# Patient Record
Sex: Female | Born: 1935
Health system: Southern US, Community
[De-identification: ages and names within clinical notes are randomized; demographics above are authoritative.]

## PROBLEM LIST (undated history)

## (undated) DIAGNOSIS — R011 Cardiac murmur, unspecified: Secondary | ICD-10-CM

## (undated) DIAGNOSIS — I10 Essential (primary) hypertension: Secondary | ICD-10-CM

## (undated) DIAGNOSIS — E7800 Pure hypercholesterolemia, unspecified: Secondary | ICD-10-CM

## (undated) DIAGNOSIS — F419 Anxiety disorder, unspecified: Secondary | ICD-10-CM

## (undated) DIAGNOSIS — K219 Gastro-esophageal reflux disease without esophagitis: Secondary | ICD-10-CM

## (undated) DIAGNOSIS — F329 Major depressive disorder, single episode, unspecified: Secondary | ICD-10-CM

## (undated) DIAGNOSIS — F32A Depression, unspecified: Secondary | ICD-10-CM

## (undated) DIAGNOSIS — I2510 Atherosclerotic heart disease of native coronary artery without angina pectoris: Secondary | ICD-10-CM

## (undated) DIAGNOSIS — D126 Benign neoplasm of colon, unspecified: Secondary | ICD-10-CM

## (undated) DIAGNOSIS — R7301 Impaired fasting glucose: Secondary | ICD-10-CM

## (undated) DIAGNOSIS — R002 Palpitations: Secondary | ICD-10-CM

## (undated) HISTORY — DX: Benign neoplasm of colon, unspecified: D12.6

## (undated) HISTORY — DX: Depression, unspecified: F32.A

## (undated) HISTORY — DX: Essential (primary) hypertension: I10

## (undated) HISTORY — DX: Gastro-esophageal reflux disease without esophagitis: K21.9

## (undated) HISTORY — DX: Pure hypercholesterolemia, unspecified: E78.00

## (undated) HISTORY — DX: Major depressive disorder, single episode, unspecified: F32.9

## (undated) HISTORY — DX: Atherosclerotic heart disease of native coronary artery without angina pectoris: I25.10

## (undated) HISTORY — DX: Impaired fasting glucose: R73.01

## (undated) HISTORY — DX: Palpitations: R00.2

## (undated) HISTORY — DX: Anxiety disorder, unspecified: F41.9

---

## 1998-10-30 ENCOUNTER — Other Ambulatory Visit: Admission: RE | Admit: 1998-10-30 | Discharge: 1998-10-30 | Payer: Self-pay | Admitting: Gynecology

## 2000-01-01 ENCOUNTER — Other Ambulatory Visit: Admission: RE | Admit: 2000-01-01 | Discharge: 2000-01-01 | Payer: Self-pay | Admitting: Gynecology

## 2000-12-17 ENCOUNTER — Other Ambulatory Visit: Admission: RE | Admit: 2000-12-17 | Discharge: 2000-12-17 | Payer: Self-pay | Admitting: Gynecology

## 2001-04-20 ENCOUNTER — Encounter: Payer: Self-pay | Admitting: Urology

## 2001-04-22 ENCOUNTER — Encounter: Payer: Self-pay | Admitting: Urology

## 2001-04-22 ENCOUNTER — Ambulatory Visit (HOSPITAL_COMMUNITY): Admission: RE | Admit: 2001-04-22 | Discharge: 2001-04-22 | Payer: Self-pay | Admitting: Urology

## 2001-04-26 ENCOUNTER — Ambulatory Visit (HOSPITAL_COMMUNITY): Admission: RE | Admit: 2001-04-26 | Discharge: 2001-04-26 | Payer: Self-pay | Admitting: Urology

## 2001-04-26 ENCOUNTER — Encounter: Payer: Self-pay | Admitting: Urology

## 2001-12-02 ENCOUNTER — Ambulatory Visit (HOSPITAL_COMMUNITY): Admission: RE | Admit: 2001-12-02 | Discharge: 2001-12-02 | Payer: Self-pay | Admitting: Urology

## 2001-12-02 ENCOUNTER — Encounter: Payer: Self-pay | Admitting: Urology

## 2001-12-20 ENCOUNTER — Other Ambulatory Visit: Admission: RE | Admit: 2001-12-20 | Discharge: 2001-12-20 | Payer: Self-pay | Admitting: Gynecology

## 2002-02-08 ENCOUNTER — Ambulatory Visit (HOSPITAL_COMMUNITY): Admission: RE | Admit: 2002-02-08 | Discharge: 2002-02-08 | Payer: Self-pay | Admitting: Family Medicine

## 2002-07-21 ENCOUNTER — Encounter: Admission: RE | Admit: 2002-07-21 | Discharge: 2002-07-21 | Payer: Self-pay

## 2002-09-19 ENCOUNTER — Ambulatory Visit (HOSPITAL_BASED_OUTPATIENT_CLINIC_OR_DEPARTMENT_OTHER): Admission: RE | Admit: 2002-09-19 | Discharge: 2002-09-19 | Payer: Self-pay | Admitting: Gynecology

## 2002-09-19 ENCOUNTER — Encounter (INDEPENDENT_AMBULATORY_CARE_PROVIDER_SITE_OTHER): Payer: Self-pay

## 2003-02-06 ENCOUNTER — Other Ambulatory Visit: Admission: RE | Admit: 2003-02-06 | Discharge: 2003-02-06 | Payer: Self-pay | Admitting: Gynecology

## 2003-05-29 ENCOUNTER — Encounter: Payer: Self-pay | Admitting: Gynecology

## 2003-05-29 ENCOUNTER — Encounter: Admission: RE | Admit: 2003-05-29 | Discharge: 2003-05-29 | Payer: Self-pay | Admitting: Gynecology

## 2003-12-14 ENCOUNTER — Ambulatory Visit: Admission: RE | Admit: 2003-12-14 | Discharge: 2003-12-14 | Payer: Self-pay | Admitting: Internal Medicine

## 2003-12-14 ENCOUNTER — Encounter (INDEPENDENT_AMBULATORY_CARE_PROVIDER_SITE_OTHER): Payer: Self-pay | Admitting: *Deleted

## 2004-02-07 ENCOUNTER — Other Ambulatory Visit: Admission: RE | Admit: 2004-02-07 | Discharge: 2004-02-07 | Payer: Self-pay | Admitting: Gynecology

## 2004-06-28 ENCOUNTER — Encounter: Admission: RE | Admit: 2004-06-28 | Discharge: 2004-06-28 | Payer: Self-pay | Admitting: Gynecology

## 2005-02-10 ENCOUNTER — Other Ambulatory Visit: Admission: RE | Admit: 2005-02-10 | Discharge: 2005-02-10 | Payer: Self-pay | Admitting: Gynecology

## 2005-02-13 ENCOUNTER — Ambulatory Visit: Payer: Self-pay | Admitting: Internal Medicine

## 2005-02-27 ENCOUNTER — Ambulatory Visit: Payer: Self-pay | Admitting: Pulmonary Disease

## 2005-04-10 ENCOUNTER — Ambulatory Visit: Payer: Self-pay | Admitting: Internal Medicine

## 2005-04-24 ENCOUNTER — Ambulatory Visit: Payer: Self-pay | Admitting: Internal Medicine

## 2005-05-19 ENCOUNTER — Ambulatory Visit: Payer: Self-pay | Admitting: Internal Medicine

## 2005-06-06 ENCOUNTER — Ambulatory Visit: Payer: Self-pay | Admitting: Internal Medicine

## 2005-06-30 ENCOUNTER — Encounter: Admission: RE | Admit: 2005-06-30 | Discharge: 2005-06-30 | Payer: Self-pay | Admitting: Gynecology

## 2005-07-18 ENCOUNTER — Other Ambulatory Visit: Admission: RE | Admit: 2005-07-18 | Discharge: 2005-07-18 | Payer: Self-pay | Admitting: Gynecology

## 2005-09-16 ENCOUNTER — Ambulatory Visit: Payer: Self-pay | Admitting: Internal Medicine

## 2005-12-23 ENCOUNTER — Ambulatory Visit: Payer: Self-pay | Admitting: Internal Medicine

## 2006-01-01 ENCOUNTER — Ambulatory Visit: Payer: Self-pay | Admitting: Pulmonary Disease

## 2006-02-12 ENCOUNTER — Other Ambulatory Visit: Admission: RE | Admit: 2006-02-12 | Discharge: 2006-02-12 | Payer: Self-pay | Admitting: Gynecology

## 2006-06-17 ENCOUNTER — Ambulatory Visit: Payer: Self-pay | Admitting: Gastroenterology

## 2006-06-19 ENCOUNTER — Ambulatory Visit: Payer: Self-pay | Admitting: Internal Medicine

## 2006-06-29 ENCOUNTER — Other Ambulatory Visit: Admission: RE | Admit: 2006-06-29 | Discharge: 2006-06-29 | Payer: Self-pay | Admitting: Gynecology

## 2006-07-01 ENCOUNTER — Ambulatory Visit: Payer: Self-pay | Admitting: Gastroenterology

## 2006-07-01 ENCOUNTER — Encounter (INDEPENDENT_AMBULATORY_CARE_PROVIDER_SITE_OTHER): Payer: Self-pay | Admitting: Specialist

## 2006-07-02 ENCOUNTER — Encounter: Admission: RE | Admit: 2006-07-02 | Discharge: 2006-07-02 | Payer: Self-pay | Admitting: Gynecology

## 2006-09-17 ENCOUNTER — Ambulatory Visit: Payer: Self-pay | Admitting: Internal Medicine

## 2006-12-28 ENCOUNTER — Other Ambulatory Visit: Admission: RE | Admit: 2006-12-28 | Discharge: 2006-12-28 | Payer: Self-pay | Admitting: Gynecology

## 2007-06-09 ENCOUNTER — Encounter: Admission: RE | Admit: 2007-06-09 | Discharge: 2007-06-09 | Payer: Self-pay | Admitting: Family Medicine

## 2007-07-01 ENCOUNTER — Other Ambulatory Visit: Admission: RE | Admit: 2007-07-01 | Discharge: 2007-07-01 | Payer: Self-pay | Admitting: Gynecology

## 2007-07-12 ENCOUNTER — Encounter: Admission: RE | Admit: 2007-07-12 | Discharge: 2007-07-12 | Payer: Self-pay | Admitting: Gynecology

## 2007-08-03 ENCOUNTER — Encounter: Admission: RE | Admit: 2007-08-03 | Discharge: 2007-08-03 | Payer: Self-pay | Admitting: Family Medicine

## 2007-08-09 ENCOUNTER — Encounter: Admission: RE | Admit: 2007-08-09 | Discharge: 2007-08-09 | Payer: Self-pay | Admitting: Family Medicine

## 2008-06-02 ENCOUNTER — Encounter: Admission: RE | Admit: 2008-06-02 | Discharge: 2008-06-02 | Payer: Self-pay | Admitting: Family Medicine

## 2008-07-12 ENCOUNTER — Encounter: Admission: RE | Admit: 2008-07-12 | Discharge: 2008-07-12 | Payer: Self-pay | Admitting: Gynecology

## 2008-07-24 ENCOUNTER — Encounter: Admission: RE | Admit: 2008-07-24 | Discharge: 2008-07-24 | Payer: Self-pay | Admitting: Gynecology

## 2009-09-10 ENCOUNTER — Encounter: Admission: RE | Admit: 2009-09-10 | Discharge: 2009-09-10 | Payer: Self-pay | Admitting: Gynecology

## 2009-10-11 ENCOUNTER — Inpatient Hospital Stay (HOSPITAL_COMMUNITY): Admission: EM | Admit: 2009-10-11 | Discharge: 2009-10-12 | Payer: Self-pay | Admitting: Emergency Medicine

## 2009-10-11 ENCOUNTER — Ambulatory Visit: Payer: Self-pay | Admitting: Internal Medicine

## 2010-01-07 ENCOUNTER — Encounter: Admission: RE | Admit: 2010-01-07 | Discharge: 2010-01-07 | Payer: Self-pay | Admitting: Family Medicine

## 2010-06-27 ENCOUNTER — Encounter: Admission: RE | Admit: 2010-06-27 | Discharge: 2010-06-27 | Payer: Self-pay | Admitting: Family Medicine

## 2010-09-08 ENCOUNTER — Encounter: Payer: Self-pay | Admitting: Gynecology

## 2010-09-08 ENCOUNTER — Encounter: Payer: Self-pay | Admitting: Family Medicine

## 2010-09-25 ENCOUNTER — Other Ambulatory Visit: Payer: Self-pay | Admitting: Gynecology

## 2010-09-25 DIAGNOSIS — Z1239 Encounter for other screening for malignant neoplasm of breast: Secondary | ICD-10-CM

## 2010-10-01 ENCOUNTER — Ambulatory Visit
Admission: RE | Admit: 2010-10-01 | Discharge: 2010-10-01 | Disposition: A | Discharge status: Home / Self Care | Payer: Medicare Other | Source: Ambulatory Visit | Attending: Gynecology | Admitting: Gynecology

## 2010-10-01 DIAGNOSIS — Z1239 Encounter for other screening for malignant neoplasm of breast: Secondary | ICD-10-CM

## 2010-11-06 LAB — CARDIAC PANEL(CRET KIN+CKTOT+MB+TROPI)
CK, MB: 0.7 ng/mL (ref 0.3–4.0)
Relative Index: INVALID (ref 0.0–2.5)
Total CK: 34 U/L (ref 7–177)
Total CK: 35 U/L (ref 7–177)
Troponin I: 0.01 ng/mL (ref 0.00–0.06)

## 2010-11-06 LAB — CK TOTAL AND CKMB (NOT AT ARMC): CK, MB: 0.6 ng/mL (ref 0.3–4.0)

## 2010-11-06 LAB — URINALYSIS, ROUTINE W REFLEX MICROSCOPIC
Bilirubin Urine: NEGATIVE
Glucose, UA: NEGATIVE mg/dL
Protein, ur: NEGATIVE mg/dL

## 2010-11-06 LAB — POCT CARDIAC MARKERS
CKMB, poc: <1 ng/mL — ABNORMAL LOW (ref 1.0–8.0)
CKMB, poc: <1 ng/mL — ABNORMAL LOW (ref 1.0–8.0)
Troponin i, poc: <0.05 ng/mL (ref 0.00–0.09)

## 2010-11-06 LAB — APTT: aPTT: 37 seconds (ref 24–37)

## 2010-11-06 LAB — LIPID PANEL
Cholesterol: 144 mg/dL (ref 0–200)
LDL Cholesterol: 67 mg/dL (ref 0–99)

## 2010-11-06 LAB — CBC
MCHC: 34.1 g/dL (ref 30.0–36.0)
MCV: 90.5 fL (ref 78.0–100.0)
Platelets: 211 10*3/uL (ref 150–400)
RBC: 4.69 MIL/uL (ref 3.87–5.11)
RDW: 12.4 % (ref 11.5–15.5)
WBC: 8.3 10*3/uL (ref 4.0–10.5)

## 2010-11-06 LAB — BASIC METABOLIC PANEL
CO2: 28 mEq/L (ref 19–32)
Calcium: 9.6 mg/dL (ref 8.4–10.5)
Calcium: 9.8 mg/dL (ref 8.4–10.5)
Chloride: 102 mEq/L (ref 96–112)
Creatinine, Ser: 0.79 mg/dL (ref 0.4–1.2)
Creatinine, Ser: 0.81 mg/dL (ref 0.4–1.2)
GFR calc Af Amer: >60 mL/min (ref >60)
GFR calc non Af Amer: >60 mL/min (ref >60)
Glucose, Bld: 93 mg/dL (ref 70–99)
Sodium: 138 mEq/L (ref 135–145)

## 2010-11-06 LAB — POCT I-STAT, CHEM 8
BUN: 9 mg/dL (ref 6–23)
Calcium, Ion: 1.23 mmol/L (ref 1.12–1.32)
Chloride: 105 mEq/L (ref 96–112)
Glucose, Bld: 111 mg/dL — ABNORMAL HIGH (ref 70–99)
HCT: 44 % (ref 36.0–46.0)
Potassium: 3.9 mEq/L (ref 3.5–5.1)

## 2010-11-06 LAB — DIFFERENTIAL
Lymphs Abs: 1.4 10*3/uL (ref 0.7–4.0)
Monocytes Relative: 7 % (ref 3–12)
Neutro Abs: 6.2 10*3/uL (ref 1.7–7.7)
Neutrophils Relative %: 75 % (ref 43–77)

## 2010-11-06 LAB — URINE MICROSCOPIC-ADD ON

## 2010-11-06 LAB — TROPONIN I: Troponin I: <0.01 ng/mL (ref 0.00–0.06)

## 2010-11-06 LAB — PROTIME-INR: INR: 1.05 (ref 0.00–1.49)

## 2011-01-03 NOTE — Assessment & Plan Note (Signed)
[facility]                               PULMONARY OFFICE NOTE   NAME:[name] [name]                     MRN:          [id number]  DATE:06/19/2006                            DOB:          [birth year]    This is a pulmonary/followup office visit.   HISTORY:  A 76 year old white female with documented bronchiectasis, in  today, all smiles, on a complex medical regimen that is reflected in the  medication calendar that was made for her on June 19, 2006, with no  complaints of any significant cough, chest pain, fevers, chills, sweats,  dyspnea or leg swelling.   PHYSICAL EXAMINATION:  She is a pleasant, ambulatory white female in no  acute distress.  She is afebrile, stable vital signs.  HEENT:  Unremarkable, oropharynx is clear.  LUNGS:  Lung fields reveal minimal rhonchi bilaterally with adequate air  movement.  There is a regular rhythm without murmur, gallop or rub.  ABDOMEN:  Soft, benign.  EXTREMITIES:  Warm without calf tenderness, cyanosis, clubbing or edema.   PFTs were reviewed from a year ago, and showed no significant airflow  obstruction.   Chest x-ray was reviewed today, and shows minimal increase in markings in  the bases with no change from 6 months ago.   IMPRESSION:  Bronchiectasis with no active cough or dyspnea.  In this  setting, she is on a reasonable maintenance regimen, but actually does not  include any pulmonary medications other than Astelin to be used for  nonspecific rhinitis.   She does have both Augmentin and Mucinex DM as well as Brovex to use if she  exacerbates with any upper or lower airway symptoms.   I reviewed each and every one of these in detail with her, including a very  elaborate and very unambiguous, user-friendly medication calendar that she  can keep with her at all times.  I have asked her to show this calendar to  Dr. Smith Mince so we all read from the same page in terms of medication  reconciliation, and see her back here in 3 months.    ______________________________  [name]. Sherene Sires, MD, [facility]    MBW/MedQ  DD: 06/19/2006  DT: 06/20/2006  Job #: [id number]   cc:   Talmadge Coventry, M.D.

## 2011-01-03 NOTE — Op Note (Signed)
   NAME:  [name] [name], [name] [name]                        ACCOUNT NO.:  [id number]   MEDICAL RECORD NO.:  [id number]                   PATIENT TYPE:  AMB   LOCATION:  [facility]                                 FACILITY:  [facility]   PHYSICIAN:  Gretta Cool, M.D.              DATE OF BIRTH:  23-Nov-1934   DATE OF PROCEDURE:  09/19/2002  DATE OF DISCHARGE:                                 OPERATIVE REPORT   PREOPERATIVE DIAGNOSES:  1. Endometrial polyp with abnormal uterine bleeding.  2. Multiple small uterine leiomyomata.   POSTOPERATIVE DIAGNOSES:  1. Endometrial polyp with abnormal uterine bleeding.  2. Multiple small uterine leiomyomata.   PROCEDURE:  1. Hysteroscopy.  2. Resection of endometrial polyp and partial endometrial ablation.   SURGEON:  Gretta Cool, M.D.   ANESTHESIA:  IV sedation, paracervical block.   DESCRIPTION OF PROCEDURE:  Under satisfactory anesthesia as above the  patient is prepped and draped in lithotomy position.  Her cervix was grasped  with a single tooth tenaculum __________ .  The cervix was then  progressively dilated with a series of Pratt dilators to 33.  The 7 mm  resectoscope was then introduced.  There was some evidence of dissection of  the myometrium during dilation, but no evidence of perforation in the very  apex of the fundus.  Careful examination of the tract revealed no evidence  of perforation.  Her endometrial cavity was extremely small.  One small  polyp was identified.  The other could not be seen.  The uterus had a  prominent T shape.  The endometrial polyp was then resected.  The majority  of the endometrial cavity was then also resected so as to eliminate the  endometrial tissue and source of recurring polyps and abnormal bleeding.  Because of now the fluid deficit that reached 80 mL, the procedure was  discontinued and patient returned to the recovery room without complete  resection of all of the endometrium.  Note, the T-shaped uterus  made any  attempt to completely resect the  endometrium very difficult because of the long tunnel of endometrium going  out to the fallopian tubes inaccessible even with the small loop.  At this  point the procedure was terminated without complication.  The patient was  returned to recovery room in excellent condition.                                               Gretta Cool, M.D.    CWL/MEDQ  D:  09/19/2002  T:  09/19/2002  Job:  [id number]   cc:   Talmadge Coventry, M.D.  [address], [address]  [place]  [place] [address]  Fax: [phone]

## 2011-01-03 NOTE — Op Note (Signed)
NAME:  [name] [name], [name] [name]                        ACCOUNT NO.:  [id number]   MEDICAL RECORD NO.:  [id number]                   PATIENT TYPE:  AMB   LOCATION:  [facility]                                 FACILITY:  [facility]   PHYSICIAN:  Casimiro Needle B. Sherene Sires, M.D. [facility]           DATE OF BIRTH:  August 21, 1934   DATE OF PROCEDURE:  12/14/2003  DATE OF DISCHARGE:                                 OPERATIVE REPORT   PROCEDURE:  Fiberoptic bronchoscopy with transbronchial biopsy of the right  middle lobe.   REFERRED BY:  Talmadge Coventry, M.D.   HISTORY:  This is a 76 year old white female with a history of  bronchiectasis and nodular appearing infiltrates in both the right middle  lobe and lingula consistent with possible MAI infection.  She has had  recurrent purulent sputum production and has been off of antibiotics for the  last several weeks and I recommended a bronchoscopy at this point both to  sample the secretions for resistant organism as well as transbronchial  biopsy to see if we could shed further light on the nodular infiltrates.  After she agreed to the procedure, we had a full discussion of the risks and  benefits in the office.  Please see dictated office notes for further  informed. There has been no known change in the history or exam since the  office evaluation was done.   She agreed to the procedure after a full discussion of the risks, benefits,  and alternatives. She was placed on continuous oxygen and monitored by  surface ECG and oximetry. She received a total of 5 mg of IV Versed and 25  mg of IV Demerol during the procedure for adequate sedation and cough  suppression.   The right nares and oropharynx were liberally anesthetized with 1% lidocaine  by updraft nebulizer. The right naris was additionally prepared with 2%  lidocaine jelly.   Using a standard flexible fiberoptic bronchoscope, the right naris was  easily cannulated with good visualization of the entire oropharynx  and  larynx. The cords moved normally and there were no apparent upper airway  lesions.   Using an additional 1% lidocaine as needed, the entire tracheobronchial tree  was explored bilaterally with the following findings:   The trachea, carina and all the major airways opened widely at the  subsegmental level. However, there were copious thin mucopurulent secretions  present throughout the right middle lobe, lingula and left upper lobe. These  were suctioned free and cultured.   Using a standard transbronchial approach into the right middle lobe,  multiple biopsies were obtained with adequate tissue for histology and  preserved in formalin. There was minimal bleeding encountered that was  cleared with saline prior to stopping the procedure and no evidence of  pneumothorax or alveolar hemorrhage by fluoroscopy.   The patient tolerated the procedure well, followup chest x-ray is pending.   IMPRESSION:  Bronchiectasis with possible opportunistic infection needed by  resistant Gram negative organisms such as pseudomonas or by atypical  microbacteria organisms.   RECOMMENDATIONS:  Check chest x-ray for any evidence of pneumothorax.  Followup as an outpatient in the next two weeks.                                               [name]. Sherene Sires, M.D. [facility]    MBW/MEDQ  D:  12/14/2003  T:  12/14/2003  Job:  [id number]   cc:   Talmadge Coventry, M.D.  [address]  [place]  [place] [address]  Fax: [phone]

## 2011-01-03 NOTE — Assessment & Plan Note (Signed)
[facility]                             PULMONARY OFFICE NOTE   NAME:MOORINGJerlyn, [name] [name]                     MRN:          [id number]  DATE:09/17/2006                            DOB:          [birth year]    PULMONARY/FINAL FOLLOW-UP OFFICE VISIT:   HISTORY:  A delightful 76 year old white female, never smoker, with  bronchiectasis documented by CT scan in December, 2003 but doing great  on her present regimen, which does not include any pulmonary  medications.  She is on Astelin 2 puffs nightly, which she says controls  her rhinitis well.  She has no significant cough or dyspnea with  activities and has documented normal PFTs in October, 2006.   For a full inventory of her medications, please see face sheet, dated  September 17, 2006.  She notes that of all of the p.r.n.'s, she only uses  occasional Mucinex and has not required any Augmentin in the last  several months for purulent sputum.   PHYSICAL EXAMINATION:  GENERAL:  She is a slightly anxious but quite  pleasant ambulatory white female in no acute distress.  VITAL SIGNS:  She is afebrile with stable vital signs.  Weight 132  pounds.  No change at baseline.  HEENT:  Unremarkable.  Her oropharynx is clear.  LUNGS:  Lung fields reveal minimal rhonchi bilaterally with excellent  air movement.  There is no cough elicited on inspiratory or expiratory  movements.  HEART:  Regular rhythm without murmur, rub or gallop.  ABDOMEN:  Soft, benign.  EXTREMITIES:  Warm without calf tenderness, clubbing, cyanosis or edema.   IMPRESSION:  Bronchiectasis with minimal symptoms and associated  nonspecific mild rhinitis controlled with Astelin.  In this setting,  there is really no reason to do regular pulmonary followup.  She is at  risk for exacerbations but already has Augmentin to take for 10 days.  I  would like to see if this does not turn the sputum back to clear  (indicating the likelihood of resistant  organisms); however, she uses  the Augmentin so infrequently that this is not likely to be a problem in  all of her refills, in fact, could be through Dr. Stephannie [name] office  unless her symptoms are not well controlled or she is having more  frequent exacerbations than is presently the case.   I would recommend repeat Pneumovax this year (my records indicate that  it was last given in 2003 and probably should get a booster this year  but I will defer this to Dr. Smith [name] as well).  Pulmonary followup can  be p.r.n.     [name] [name]. [name] Sires, MD, [facility]  Electronically Signed   MBW/MedQ  DD: 09/17/2006  DT: 09/17/2006  Job #: [id number]

## 2011-02-18 ENCOUNTER — Encounter: Payer: Self-pay | Admitting: Emergency Medicine

## 2011-02-18 ENCOUNTER — Ambulatory Visit (INDEPENDENT_AMBULATORY_CARE_PROVIDER_SITE_OTHER): Payer: Medicare Other | Admitting: Emergency Medicine

## 2011-02-18 DIAGNOSIS — R059 Cough, unspecified: Secondary | ICD-10-CM

## 2011-02-18 DIAGNOSIS — R05 Cough: Secondary | ICD-10-CM

## 2011-02-18 DIAGNOSIS — J309 Allergic rhinitis, unspecified: Secondary | ICD-10-CM

## 2011-02-18 DIAGNOSIS — K219 Gastro-esophageal reflux disease without esophagitis: Secondary | ICD-10-CM

## 2011-02-18 DIAGNOSIS — R053 Chronic cough: Secondary | ICD-10-CM

## 2011-02-18 DIAGNOSIS — J310 Chronic rhinitis: Secondary | ICD-10-CM | POA: Insufficient documentation

## 2011-02-18 NOTE — Patient Instructions (Signed)
Please increase your Protonix to twice a day for the next two weeks. Then go back to once a day Continue loratadine daily Start using astepro 0.15%, 2 sprays each nostril twice a day Follow up with Dr Delton Coombes in 1 month

## 2011-02-18 NOTE — Assessment & Plan Note (Signed)
With contributions of both GERD and nasal gtt.  - will increase protonix to bid x 2 weeks, then back to qd - add astepro to loratadine - rov 1 month

## 2011-02-18 NOTE — Progress Notes (Signed)
  Subjective:    Patient ID: [name] [name], female    DOB: 16-May-1935, 76 y.o.   MRN: [id number]  HPI 76 yo woman, never smoker, Hx of HTN, hyperlipidemia. Seen in the past her by Dr Sherene Sires for chronic cough. She has chronic rhinitis, on loratadine and mucinex.  About 2 weeks ago she developed sore throat after exposed to her granddaughter who had URI. She had the same cough and mucous productive of green mucous during her viral illness, treated initially w azithro, then the green mucous better since going on levaquin 10 days ago (finished today). She still feels drained, may have improved some. The cough seemed to bother her the most at night during this URI, can bother her at any time during the day. She is on protonix, still has some breakthrough symptoms.    Review of Systems  Constitutional: Positive for appetite change.  HENT: Positive for sore throat.   Respiratory: Positive for cough.   Gastrointestinal:       Heartburn indigestion   Past Medical History  Diagnosis Date  . Hypertension   . Hypercholesteremia      Family History  Problem Relation Age of Onset  . Emphysema Father   . Brain cancer Brother      History   Social History  . Marital Status: Widowed    Spouse Name: N/A    Number of Children: N/A  . Years of Education: N/A   Occupational History  . Diplomatic Services operational officer     retired   Social History Main Topics  . Smoking status: Never Smoker   . Smokeless tobacco: Not on file  . Alcohol Use: No  . Drug Use: No  . Sexually Active: Not on file   Other Topics Concern  . Not on file   Social History Narrative  . No narrative on file     No Known Allergies   No outpatient prescriptions prior to visit.         Objective:   Physical Exam Gen: Pleasant, thin woman, in no distress,  normal affect  ENT: No lesions,  mouth clear,  oropharynx clear, mild nasal congestion  Neck: No JVD, no TMG, no carotid bruits, no stridor  Lungs: No use of accessory  muscles, no dullness to percussion, clear without rales or rhonchi  Cardiovascular: RRR, heart sounds normal, no murmur or gallops, no peripheral edema  Musculoskeletal: No deformities, no cyanosis or clubbing  Neuro: alert, non focal  Skin: Warm, no lesions or rashes      Assessment & Plan:  Chronic cough With contributions of both GERD and nasal gtt.  - will increase protonix to bid x 2 weeks, then back to qd - add astepro to loratadine - rov 1 month

## 2011-03-21 ENCOUNTER — Ambulatory Visit (INDEPENDENT_AMBULATORY_CARE_PROVIDER_SITE_OTHER): Payer: Medicare Other | Admitting: Emergency Medicine

## 2011-03-21 ENCOUNTER — Encounter: Payer: Self-pay | Admitting: Emergency Medicine

## 2011-03-21 DIAGNOSIS — J479 Bronchiectasis, uncomplicated: Secondary | ICD-10-CM

## 2011-03-21 DIAGNOSIS — R05 Cough: Secondary | ICD-10-CM

## 2011-03-21 MED ORDER — FLUTICASONE PROPIONATE 50 MCG/ACT NA SUSP: 2.0000 | Freq: Two times a day (BID) | NASAL | Status: DC

## 2011-03-21 NOTE — Assessment & Plan Note (Signed)
-   will sample sputum, send for AFB, fungal, bacterial cx - CT scan chest, no contrast

## 2011-03-21 NOTE — Assessment & Plan Note (Signed)
-   continue zyrtec  - add nasal steroid - omeprazole daily

## 2011-03-21 NOTE — Patient Instructions (Signed)
We will repeat your CT scan of the chest  Continue your zyrtec, omeprazole.  Start nasonex 2 sprays each nostril daily.  We sputum samples and send these for culture.  Follow up with Dr Delton Coombes in 6 weeks to review

## 2011-03-21 NOTE — Progress Notes (Signed)
Addended by: Michel Bickers A on: 03/21/2011 02:05 PM   Modules accepted: Orders

## 2011-03-21 NOTE — Progress Notes (Signed)
  Subjective:    Patient ID: [name] [name], female    DOB: 1935/06/28, 76 y.o.   MRN: [id number]  HPI 76 yo woman, never smoker, Hx of HTN, hyperlipidemia. Seen in the past her by Dr Sherene Sires for chronic cough and bronchiectasis on CT scan of the chest.   She has chronic rhinitis, on loratadine and mucinex.  About 2 weeks ago she developed sore throat after exposed to her granddaughter who had URI. She had the same cough and mucous productive of green mucous during her viral illness, treated initially w azithro, then the green mucous better since going on levaquin 10 days ago (finished today). She still feels drained, may have improved some. The cough seemed to bother her the most at night during this URI, can bother her at any time during the day. She is on protonix, still has some breakthrough symptoms.   ROV 03/21/11 -- follow up cough, bronchiectasis on CT scan 07/21/02. Has had cough following rx for bronchitis as above with levofloxacin. Last time we added astepro to loratadine, doubled omeprazole x 2 weeks. She actually changed loratadine to zyrtec. She couldn't tolerate the astepro - stopped it due to facial pain. Her cough hasn't really changed, still coughing up green mucous.    Review of Systems  Constitutional: Positive for appetite change.  HENT: Positive for sore throat.   Respiratory: Positive for cough.   Gastrointestinal:       Heartburn indigestion     Objective:   Physical Exam Gen: Pleasant, thin woman, in no distress,  normal affect  ENT: No lesions,  mouth clear,  oropharynx clear, mild nasal congestion  Neck: No JVD, no TMG, no carotid bruits, no stridor  Lungs: No use of accessory muscles, no dullness to percussion, clear without rales or rhonchi  Cardiovascular: RRR, heart sounds normal, no murmur or gallops, no peripheral edema  Musculoskeletal: No deformities, no cyanosis or clubbing  Neuro: alert, non focal  Skin: Warm, no lesions or rashes      Assessment  & Plan:  Bronchiectasis without acute exacerbation - will sample sputum, send for AFB, fungal, bacterial cx - CT scan chest, no contrast   Chronic cough - continue zyrtec  - add nasal steroid - omeprazole daily

## 2011-03-25 ENCOUNTER — Ambulatory Visit (INDEPENDENT_AMBULATORY_CARE_PROVIDER_SITE_OTHER)
Admission: RE | Admit: 2011-03-25 | Discharge: 2011-03-25 | Disposition: A | Discharge status: Home / Self Care | Payer: Medicare Other | Source: Ambulatory Visit | Attending: Emergency Medicine | Admitting: Emergency Medicine

## 2011-03-25 DIAGNOSIS — J479 Bronchiectasis, uncomplicated: Secondary | ICD-10-CM

## 2011-04-02 ENCOUNTER — Telehealth: Payer: Self-pay | Admitting: *Deleted

## 2011-04-02 NOTE — Progress Notes (Signed)
?   Did she ever bring sample to the lab?

## 2011-04-02 NOTE — Telephone Encounter (Signed)
Pt still needs to bring sputum sample in to the lab for testing. Pt says she still has a sterile cup and will try to collect a first morning sputum and bring it by the lab the same day.  Will check back in a few days to see if pt has brought specimen by the lab. Respiratory and AFB culture.

## 2011-04-04 ENCOUNTER — Other Ambulatory Visit: Payer: Medicare Other

## 2011-04-04 ENCOUNTER — Other Ambulatory Visit: Payer: Self-pay | Admitting: Emergency Medicine

## 2011-04-04 DIAGNOSIS — R05 Cough: Secondary | ICD-10-CM

## 2011-04-28 LAB — FUNGUS CULTURE W SMEAR

## 2011-04-29 ENCOUNTER — Other Ambulatory Visit: Payer: Self-pay | Admitting: Gynecology

## 2011-05-02 ENCOUNTER — Encounter: Payer: Self-pay | Admitting: Emergency Medicine

## 2011-05-02 ENCOUNTER — Ambulatory Visit (INDEPENDENT_AMBULATORY_CARE_PROVIDER_SITE_OTHER): Payer: Medicare Other | Admitting: Emergency Medicine

## 2011-05-02 VITALS — BP 130/78 | HR 55 | Temp 97.9°F | Ht 63.5 in | Wt 126.4 lb

## 2011-05-02 DIAGNOSIS — J479 Bronchiectasis, uncomplicated: Secondary | ICD-10-CM

## 2011-05-02 NOTE — Progress Notes (Signed)
Subjective:    Patient ID: [name] [name], female    DOB: 1935/05/03, 76 y.o.   MRN: [id number]  HPI 76 yo woman, never smoker, Hx of HTN, hyperlipidemia. Seen in the past her by Dr Sherene Sires for chronic cough and bronchiectasis on CT scan of the chest.   She has chronic rhinitis, on loratadine and mucinex.  About 2 weeks ago she developed sore throat after exposed to her granddaughter who had URI. She had the same cough and mucous productive of green mucous during her viral illness, treated initially w azithro, then the green mucous better since going on levaquin 10 days ago (finished today). She still feels drained, may have improved some. The cough seemed to bother her the most at night during this URI, can bother her at any time during the day. She is on protonix, still has some breakthrough symptoms.   ROV 03/21/11 -- follow up cough, bronchiectasis on CT scan 07/21/02. Has had cough following rx for bronchitis as above with levofloxacin. Last time we added astepro to loratadine, doubled omeprazole x 2 weeks. She actually changed loratadine to zyrtec. She couldn't tolerate the astepro - stopped it due to facial pain. Her cough hasn't really changed, still coughing up green mucous.    ROV 05/02/11 -- cough, bronchiectasis on CT scan.  Her repeat CT scan 8/7 showed some progression of her bronchiectasis and some associated nodular disease, most consistent with MAIC. She is still having cough, green phlegm.     Review of Systems  Constitutional: Positive for appetite change.  HENT: Positive for sore throat.   Respiratory: Positive for cough.   Gastrointestinal:       Heartburn indigestion     Objective:   Physical Exam Gen: Pleasant, thin woman, in no distress,  normal affect  ENT: No lesions,  mouth clear,  oropharynx clear, mild nasal congestion  Neck: No JVD, no TMG, no carotid bruits, no stridor  Lungs: No use of accessory muscles, no dullness to percussion, clear without rales or  rhonchi  Cardiovascular: RRR, heart sounds normal, no murmur or gallops, no peripheral edema  Musculoskeletal: No deformities, no cyanosis or clubbing  Neuro: alert, non focal  Skin: Warm, no lesions or rashes  CT scan   03/25/11: Comparison: 06/27/2010    Findings: Lung windows demonstrate medial right apical lung nodule   which measures 6 mm on image 11 of series 3 and is either new or   enlarged since the prior exam.   Similar appearance of cylindrical bronchiectasis within the right   upper lobe, lingula, and right middle lobe.  Concurrent volume loss   within the right middle lobe and lingula.   Scattered  small nodules within the bilateral upper lobes, also   similar.  No evidence of acute superimposed infection.    Soft tissue windows demonstrate normal heart size without   pericardial or pleural effusion.    Multivessel coronary artery atherosclerosis.  Small middle   mediastinal lymph nodes. No mediastinal or definite hilar   adenopathy, given limitations of unenhanced CT.    Limited abdominal imaging demonstrates no significant findings.    IMPRESSION:    1.  Similar appearance of multifocal bronchiectasis and areas of   volume loss/micro nodularity.  Favor the sequelae of atypical   infection, most likely Mycobacterium avium intracellular.  No   evidence of acute or subacute superimposed infection.   2.  Right upper lobe pulmonary nodule of 6 mm.  Not present on the  prior. If the patient is at high risk for bronchogenic carcinoma,   follow-up chest CT at 6-12 months is recommended.  If the patient   is at low risk for bronchogenic carcinoma, follow-up chest CT at 12   months is recommended.    Assessment & Plan:  Bronchiectasis without acute exacerbation Her CT scan shows progression, surrounding micronodular dz consistent with atypical infxn. I have recommended FOB to get better cx data but she wants to defer.  - repeat CT scan in 6 months - decide  regarding repeat FOB at that time depending onh [progresison of disease.  - rov 6 months

## 2011-05-02 NOTE — Patient Instructions (Signed)
Continue your current medications We will repeat your your CT scan of the chest in February 2013 Follow up with Dr Delton Coombes in 6 months to review

## 2011-05-02 NOTE — Assessment & Plan Note (Signed)
Her CT scan shows progression, surrounding micronodular dz consistent with atypical infxn. I have recommended FOB to get better cx data but she wants to defer.  - repeat CT scan in 6 months - decide regarding repeat FOB at that time depending onh [progresison of disease.  - rov 6 months

## 2011-05-13 ENCOUNTER — Ambulatory Visit (HOSPITAL_COMMUNITY): Admission: RE | Admit: 2011-05-13 | Payer: Medicare Other | Source: Ambulatory Visit

## 2011-06-27 ENCOUNTER — Encounter: Payer: Self-pay | Admitting: Gastroenterology

## 2011-10-24 ENCOUNTER — Other Ambulatory Visit: Payer: Self-pay | Admitting: Gynecology

## 2011-10-24 DIAGNOSIS — Z1231 Encounter for screening mammogram for malignant neoplasm of breast: Secondary | ICD-10-CM

## 2011-11-11 ENCOUNTER — Ambulatory Visit
Admission: RE | Admit: 2011-11-11 | Discharge: 2011-11-11 | Disposition: A | Discharge status: Home / Self Care | Payer: Medicare Other | Source: Ambulatory Visit | Attending: Gynecology | Admitting: Gynecology

## 2011-11-11 DIAGNOSIS — Z1231 Encounter for screening mammogram for malignant neoplasm of breast: Secondary | ICD-10-CM

## 2012-05-04 ENCOUNTER — Other Ambulatory Visit: Payer: Self-pay | Admitting: Gynecology

## 2012-05-07 ENCOUNTER — Encounter: Payer: Self-pay | Admitting: Gastroenterology

## 2013-04-12 ENCOUNTER — Other Ambulatory Visit: Payer: Self-pay

## 2013-04-12 DIAGNOSIS — Z1231 Encounter for screening mammogram for malignant neoplasm of breast: Secondary | ICD-10-CM

## 2013-05-02 ENCOUNTER — Ambulatory Visit
Admission: RE | Admit: 2013-05-02 | Discharge: 2013-05-02 | Disposition: A | Discharge status: Home / Self Care | Payer: Medicare Other | Source: Ambulatory Visit

## 2013-05-02 DIAGNOSIS — Z1231 Encounter for screening mammogram for malignant neoplasm of breast: Secondary | ICD-10-CM

## 2014-02-25 ENCOUNTER — Emergency Department (HOSPITAL_COMMUNITY)
Admission: EM | Admit: 2014-02-25 | Discharge: 2014-02-25 | Disposition: A | Discharge status: Home / Self Care | Payer: Medicare Other | Attending: Emergency Medicine | Admitting: Emergency Medicine

## 2014-02-25 ENCOUNTER — Encounter (HOSPITAL_COMMUNITY): Payer: Self-pay | Admitting: Emergency Medicine

## 2014-02-25 ENCOUNTER — Emergency Department (HOSPITAL_COMMUNITY): Payer: Medicare Other

## 2014-02-25 DIAGNOSIS — R042 Hemoptysis: Secondary | ICD-10-CM

## 2014-02-25 DIAGNOSIS — Z79899 Other long term (current) drug therapy: Secondary | ICD-10-CM | POA: Insufficient documentation

## 2014-02-25 DIAGNOSIS — E7800 Pure hypercholesterolemia, unspecified: Secondary | ICD-10-CM | POA: Insufficient documentation

## 2014-02-25 DIAGNOSIS — I10 Essential (primary) hypertension: Secondary | ICD-10-CM | POA: Insufficient documentation

## 2014-02-25 DIAGNOSIS — J479 Bronchiectasis, uncomplicated: Secondary | ICD-10-CM | POA: Insufficient documentation

## 2014-02-25 LAB — CBC
HEMATOCRIT: 39.4 % (ref 36.0–46.0)
HEMOGLOBIN: 12.9 g/dL (ref 12.0–15.0)
MCH: 29.1 pg (ref 26.0–34.0)
MCHC: 32.7 g/dL (ref 30.0–36.0)
MCV: 88.9 fL (ref 78.0–100.0)
PLATELETS: 243 10*3/uL (ref 150–400)
RBC: 4.43 MIL/uL (ref 3.87–5.11)
RDW: 12.4 % (ref 11.5–15.5)
WBC: 8 10*3/uL (ref 4.0–10.5)

## 2014-02-25 LAB — PROTIME-INR
INR: 1 (ref 0.00–1.49)
Prothrombin Time: 13.2 seconds (ref 11.6–15.2)

## 2014-02-25 MED ORDER — CIPROFLOXACIN HCL 750 MG PO TABS: 750.0000 mg | ORAL_TABLET | Freq: Two times a day (BID) | ORAL | Status: DC

## 2014-02-25 MED ORDER — CIPROFLOXACIN HCL 500 MG PO TABS
750.0000 mg | ORAL_TABLET | Freq: Once | ORAL | Status: AC
  Administered 2014-02-25: 750 mg via ORAL
  Filled 2014-02-25: qty 2

## 2014-02-25 MED ORDER — HYDROCOD POLST-CHLORPHEN POLST 10-8 MG/5ML PO LQCR: 5.0000 mL | Freq: Two times a day (BID) | ORAL | Status: DC | PRN

## 2014-02-25 NOTE — ED Provider Notes (Signed)
CSN: [id number]     Arrival date & time 02/25/14  1209 History   First MD Initiated Contact with Patient 02/25/14 1500     Chief Complaint  Patient presents with  . Hemoptysis     (Consider location/radiation/quality/duration/timing/severity/associated sxs/prior Treatment) The history is provided by the patient.  JOHNESHA ACHEAMPONG is a 79 y.o. female hx of HTN, HL, bronchiectasis here with hemoptysis. She coughs chronically but usually with clear sputum. She had a severe coughing spell 2 days ago. Yesterday she coughed up around 1 teaspoon full of blood. Had another episode this morning. Denies shortness of breath or chest pain. Denies history of PE or recent travel. Had similar episode several years ago but hasn't been seeing pulmonology for years.    Past Medical History  Diagnosis Date  . Hypertension   . Hypercholesteremia    No past surgical history on file. Family History  Problem Relation Age of Onset  . Emphysema Father   . Brain cancer Brother    History  Substance Use Topics  . Smoking status: Never Smoker   . Smokeless tobacco: Not on file  . Alcohol Use: No   OB History   Grav Para Term Preterm Abortions TAB SAB Ect Mult Living                 Review of Systems  Respiratory: Positive for cough.   All other systems reviewed and are negative.     Allergies  Review of patient's allergies indicates not on file.  Home Medications   Prior to Admission medications   Medication Sig Start Date End Date Taking? Authorizing Provider  acetaminophen (TYLENOL) 500 MG tablet Take 500 mg by mouth every 6 (six) hours as needed for mild pain.    Yes Historical Provider, MD  ALPRAZolam Duanne Moron) 0.25 MG tablet Take 0.25 mg by mouth at bedtime as needed for sleep.    Yes Historical Provider, MD  bisoprolol-hydrochlorothiazide (ZIAC) 5-6.25 MG per tablet Take 1 tablet by mouth daily. Once daily 12/19/10  Yes Historical Provider, MD  calcium citrate-vitamin D 200-200 MG-UNIT  TABS Take 1 tablet by mouth daily.    Yes Historical Provider, MD  cetirizine (ZYRTEC) 10 MG tablet Take 10 mg by mouth daily.     Yes Historical Provider, MD  pseudoephedrine-guaifenesin (MUCINEX D) 60-600 MG per tablet Take 1 tablet by mouth daily.     Yes Historical Provider, MD  ranitidine (ZANTAC) 75 MG tablet Take 75 mg by mouth 2 (two) times daily.   Yes Historical Provider, MD  simvastatin (ZOCOR) 40 MG tablet Take 40 mg by mouth daily. Once daily 12/19/10  Yes Historical Provider, MD   BP 183/66  Pulse 68  Temp(Src) 98.7 F (37.1 C) (Oral)  Resp 16  SpO2 100% Physical Exam  Nursing note and vitals reviewed. Constitutional: She is oriented to person, place, and time. She appears well-nourished.  NAD   HENT:  Head: Normocephalic.  Mouth/Throat: Oropharynx is clear and moist.  Eyes: Conjunctivae are normal. Pupils are equal, round, and reactive to light.  Neck: Normal range of motion. Neck supple.  Cardiovascular: Normal rate, regular rhythm and normal heart sounds.   Pulmonary/Chest: Effort normal and breath sounds normal. No respiratory distress. She has no wheezes. She has no rales.  Abdominal: Soft. Bowel sounds are normal. She exhibits no distension. There is no tenderness. There is no rebound and no guarding.  Musculoskeletal: Normal range of motion. She exhibits no edema and no tenderness.  Neurological:  She is alert and oriented to person, place, and time. No cranial nerve deficit. Coordination normal.  Skin: Skin is warm and dry.  Psychiatric: She has a normal mood and affect. Her behavior is normal. Judgment and thought content normal.    ED Course  Procedures (including critical care time) Labs Review Labs Reviewed  CULTURE, EXPECTORATED SPUTUM-ASSESSMENT  CBC  PROTIME-INR    Imaging Review Dg Chest 2 View  02/25/2014   CLINICAL DATA:  Hemoptysis.  EXAM: CHEST  2 VIEW  COMPARISON:  CT chest 03/25/2011, 06/27/2010. Chest x-ray 06/20/2010  FINDINGS: Mediastinum  and hilar structures are normal. Nodular pleural parenchymal thickening noted throughout both lungs consistent with chronic interstitial lung disease/scarring most likely from prior granulomatous disease. Similar findings noted on prior studies. No acute infiltrate. No pleural effusion or pneumothorax. Stable cardiomegaly. Pulmonary vascularity is normal. No acute bony abnormality.  IMPRESSION: Chronic nodular interstitial lung disease most consistent with old granulomas disease. Exam is stable from prior studies dating to 2011. No acute cardiopulmonary disease.   Electronically Signed   By: Marcello Moores  Register   On: 02/25/2014 14:09     EKG Interpretation None      MDM   Final diagnoses:  None   MIANNA IEZZI is a 79 y.o. female here with 2 episodes of hemoptysis. Vitals stable. O2 was 100% on RA. Lungs clear. CBC nl. CXR showed no infiltrate. I called Dr. Lake Bells from pulmonary, who recommend sputum culture, treat empirically with cipro for pseudomonas. Ok to give tussionex prescription as well.     Wandra Arthurs, MD 02/25/14 [phone]

## 2014-02-25 NOTE — ED Notes (Signed)
Bed: [facility] Expected date:  Expected time:  Means of arrival:  Comments: No bed. 

## 2014-02-25 NOTE — ED Notes (Signed)
Pt states that she has a lung problem w/o dx.  States that she saw dr wort for same problem a few years ago (coughing up blood).  States that she coughed up blood twice this morning and it scared her so she came to the ER.  Denies night sweats, fever, etc.

## 2014-02-25 NOTE — Discharge Instructions (Signed)
Take cipro for a week.   You may use tussionex for cough.   Pulmonology office will call you for appointment. If you don't get a call, call them next week.   Return to ER if you have worse cough, fever, trouble breathing, more blood in sputum.

## 2014-02-27 ENCOUNTER — Telehealth: Payer: Self-pay | Admitting: *Deleted

## 2014-02-27 NOTE — Telephone Encounter (Signed)
Called mobile number listed -- reached daughter, asked to contact patient at [phone] Called pt home number- NA, continuous ring until disconnected wcb

## 2014-02-27 NOTE — Telephone Encounter (Signed)
Hi All,   The patient was seen in the [facility] ED on 7/11 and needs f/u with anyone in pulmonary in 1-2 weeks.   Thanks  [employer]  ---  Pt last seen 04/2011. Called pt home # line rang several times and no VM, NA Called cell # [id number] [phone]

## 2014-02-27 NOTE — Telephone Encounter (Signed)
Scheduled by front staff to see TP 03/06/14 at 1130 HFU Nothing further needed.

## 2014-03-06 ENCOUNTER — Ambulatory Visit (INDEPENDENT_AMBULATORY_CARE_PROVIDER_SITE_OTHER): Payer: Medicare Other | Admitting: Adult Health

## 2014-03-06 ENCOUNTER — Encounter: Payer: Self-pay | Admitting: Adult Health

## 2014-03-06 VITALS — BP 144/70 | HR 66 | Temp 97.6°F | Ht 63.0 in | Wt 114.0 lb

## 2014-03-06 DIAGNOSIS — J471 Bronchiectasis with (acute) exacerbation: Secondary | ICD-10-CM

## 2014-03-06 NOTE — Patient Instructions (Signed)
May use Mucinex DM twice daily as needed. For cough and congestion. Follow up Dr. Lamonte Sakai in 6 weeks and as needed. Please contact office for sooner follow up if symptoms do not improve or worsen or seek emergency care

## 2014-03-06 NOTE — Progress Notes (Signed)
Subjective:    Patient ID: [name] [name], female    DOB: May 23, 1935, 79 y.o.   MRN: [id number]  HPI 79 yo woman, never smoker, Hx of HTN, hyperlipidemia. Seen in the past her by Dr Melvyn Novas for chronic cough and bronchiectasis on CT scan of the chest.   She has chronic rhinitis, on loratadine and mucinex.  About 2 weeks ago she developed sore throat after exposed to her granddaughter who had URI. She had the same cough and mucous productive of green mucous during her viral illness, treated initially w azithro, then the green mucous better since going on levaquin 10 days ago (finished today). She still feels drained, may have improved some. The cough seemed to bother her the most at night during this URI, can bother her at any time during the day. She is on protonix, still has some breakthrough symptoms.   ROV 03/21/11 -- follow up cough, bronchiectasis on CT scan 07/21/02. Has had cough following rx for bronchitis as above with levofloxacin. Last time we added astepro to loratadine, doubled omeprazole x 2 weeks. She actually changed loratadine to zyrtec. She couldn't tolerate the astepro - stopped it due to facial pain. Her cough hasn't really changed, still coughing up green mucous.    ROV 05/02/11 -- cough, bronchiectasis on CT scan.  Her repeat CT scan 8/7 showed some progression of her bronchiectasis and some associated nodular disease, most consistent with MAIC. She is still having cough, green phlegm.   03/06/2014 ER follow up (Bronchiectasis )  Patient returns for a one-week followup for recent emergency room visit. Patient was seen in emergency room for hemoptysis and bronchiectasis, exacerbation. She was treated with antibiotics, with Cipro. Chest x-ray showed no acute infiltrate.. 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  She finiished Cipro yesterday, and is feeling better. No further hemoptysis .  She denies any fever, or chest pain,  orthopnea, PND, or leg swelling.   Not seen in ~3 yrs - says doing fine until last week.  Never went for CT chest as recommended.     Review of Systems  Constitutional:   No  weight loss, night sweats,  Fevers, chills, + fatigue, or  lassitude.  HEENT:   No headaches,  Difficulty swallowing,  Tooth/dental problems, or  Sore throat,                No sneezing, itching, ear ache,  +nasal congestion, post nasal drip,   CV:  No chest pain,  Orthopnea, PND, swelling in lower extremities, anasarca, dizziness, palpitations, syncope.   GI  No heartburn, indigestion, abdominal pain, nausea, vomiting, diarrhea, change in bowel habits, loss of appetite, bloody stools.   Resp:    No chest wall deformity  Skin: no rash or lesions.  GU: no dysuria, change in color of urine, no urgency or frequency.  No flank pain, no hematuria   MS:  No joint pain or swelling.  No decreased range of motion.  No back pain.  Psych:  No change in mood or affect. No depression or anxiety.  No memory loss.        Objective:   Physical Exam Gen: Pleasant, thin woman, in no distress,  normal affect  ENT: No lesions,  mouth clear,  oropharynx clear, mild nasal congestion  Neck: No JVD, no TMG, no carotid bruits, no stridor  Lungs: No use of accessory muscles, no dullness to percussion, diminished BS in bases   Cardiovascular: RRR,  heart sounds normal, no murmur or gallops, no peripheral edema  Musculoskeletal: No deformities, no cyanosis or clubbing  Neuro: alert, non focal  Skin: Warm, no lesions or rashes    Assessment & Plan:

## 2014-03-06 NOTE — Assessment & Plan Note (Addendum)
Flare now resolved  May need CT chest to assess progression of bronchiectasis , discuss on return with Dr. Lamonte Sakai     Plan  May use Mucinex DM twice daily as needed. For cough and congestion. Follow up Dr. Lamonte Sakai in 6 weeks and as needed. Please contact office for sooner follow up if symptoms do not improve or worsen or seek emergency care

## 2014-04-18 ENCOUNTER — Encounter: Payer: Self-pay | Admitting: Emergency Medicine

## 2014-04-18 ENCOUNTER — Ambulatory Visit (INDEPENDENT_AMBULATORY_CARE_PROVIDER_SITE_OTHER): Payer: Medicare Other | Admitting: Emergency Medicine

## 2014-04-18 VITALS — BP 154/68 | HR 63 | Ht 63.0 in | Wt 117.0 lb

## 2014-04-18 DIAGNOSIS — J479 Bronchiectasis, uncomplicated: Secondary | ICD-10-CM

## 2014-04-18 MED ORDER — CLARITHROMYCIN 250 MG PO TABS: 250.0000 mg | ORAL_TABLET | Freq: Three times a day (TID) | ORAL | Status: DC

## 2014-04-18 NOTE — Patient Instructions (Signed)
We will perform a CT scan of the chest Take clarithromycin as directed until gone We may decide to perform bronchoscopy depending on the results of your CT scan Follow with Dr Lamonte Sakai in 1 month

## 2014-04-18 NOTE — Progress Notes (Signed)
Subjective:    Patient ID: [name] [name], female    DOB: 1934-12-15, 79 y.o.   MRN: [id number]  HPI 79 yo woman, never smoker, Hx of HTN, hyperlipidemia. Seen in the past her by Dr Melvyn Novas for chronic cough and bronchiectasis on CT scan of the chest.   She has chronic rhinitis, on loratadine and mucinex.  About 2 weeks ago she developed sore throat after exposed to her granddaughter who had URI. She had the same cough and mucous productive of green mucous during her viral illness, treated initially w azithro, then the green mucous better since going on levaquin 10 days ago (finished today). She still feels drained, may have improved some. The cough seemed to bother her the most at night during this URI, can bother her at any time during the day. She is on protonix, still has some breakthrough symptoms.   ROV 03/21/11 -- follow up cough, bronchiectasis on CT scan 07/21/02. Has had cough following rx for bronchitis as above with levofloxacin. Last time we added astepro to loratadine, doubled omeprazole x 2 weeks. She actually changed loratadine to zyrtec. She couldn't tolerate the astepro - stopped it due to facial pain. Her cough hasn't really changed, still coughing up green mucous.    ROV 05/02/11 -- cough, bronchiectasis on CT scan.  Her repeat CT scan 8/7 showed some progression of her bronchiectasis and some associated nodular disease, most consistent with MAIC. She is still having cough, green phlegm.   ER follow up (Bronchiectasis ) 03/06/14 --  Patient returns for a one-week followup for recent emergency room visit. Patient was seen in emergency room for hemoptysis and bronchiectasis, exacerbation. She was treated with antibiotics, with Cipro. Chest x-ray showed no acute infiltrate.. 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  She finiished Cipro yesterday, and is feeling better. No further hemoptysis .  She denies any fever, or chest pain,  orthopnea, PND, or leg swelling.   Not seen in ~3 yrs - says doing fine until last week.  Never went for CT chest as recommended.   ROV 04/18/14 -- hx cough, bronchiectasis as above. Last saw her in 2012, but she has been to the ED and then seen here by TP with cough productive of green, then some hemoptysis. At her baseline she coughs green mucous every day. She was treated w cipro and blood resolved. Now today she saw hemoptysis again. She has daily clear nasal gtt. Her last CT was 03/25/11, her FOB was 12/2010.        Objective:   Physical Exam Filed Vitals:   04/18/14 1333  BP: 154/68  Pulse: 63  Height: 5\' 3"  (1.6 m)  Weight: 117 lb (53.071 kg)  SpO2: 97%   Gen: Pleasant, thin woman, in no distress,  normal affect  ENT: No lesions,  mouth clear,  oropharynx clear, mild nasal congestion  Neck: No JVD, no TMG, no carotid bruits, no stridor  Lungs: No use of accessory muscles, no dullness to percussion, diminished BS in bases   Cardiovascular: RRR, heart sounds normal, no murmur or gallops, no peripheral edema  Musculoskeletal: No deformities, no cyanosis or clubbing  Neuro: alert, non focal  Skin: Warm, no lesions or rashes    Assessment & Plan:   Bronchiectasis without acute exacerbation Suspect related to her bronchiectasis. She was just treated for acute flare with cipro. She needs a CT scan of the chest to compare with 2012. Will treat her with clarithromycin,  follow mucus production and hemoptysis. Depending on resolution and her CT scan results we may decide to perform bronchoscopy. She is at risk for opportunistic infection.

## 2014-04-18 NOTE — Assessment & Plan Note (Signed)
Suspect related to her bronchiectasis. She was just treated for acute flare with cipro. She needs a CT scan of the chest to compare with 2012. Will treat her with clarithromycin, follow mucus production and hemoptysis. Depending on resolution and her CT scan results we may decide to perform bronchoscopy. She is at risk for opportunistic infection.

## 2014-04-25 ENCOUNTER — Telehealth: Payer: Self-pay | Admitting: *Deleted

## 2014-04-25 ENCOUNTER — Ambulatory Visit (INDEPENDENT_AMBULATORY_CARE_PROVIDER_SITE_OTHER)
Admission: RE | Admit: 2014-04-25 | Discharge: 2014-04-25 | Disposition: A | Discharge status: Home / Self Care | Payer: Medicare Other | Source: Ambulatory Visit | Attending: Emergency Medicine | Admitting: Emergency Medicine

## 2014-04-25 DIAGNOSIS — R911 Solitary pulmonary nodule: Secondary | ICD-10-CM

## 2014-04-25 DIAGNOSIS — J479 Bronchiectasis, uncomplicated: Secondary | ICD-10-CM

## 2014-04-25 NOTE — Telephone Encounter (Signed)
Call report from [facility] - [name] [name] has CT Chest High Res Please see results.  Thanks.   2. Importantly, today's study demonstrates a new macrolobulated  pleural-based 1.2 x 1.9 cm spiculated nodule left lower lobe.  Although this could certainly be related to an area of acute  infection superimposed upon the background of chronic lung disease,  the possibility of a developing neoplasm warrants consideration.  Consideration for short-term trial of antimicrobial therapy followed  by short-term repeat chest CT in 1-2 months is suggested,  particularly given that both infectious and neoplastic nodules will  appear hypermetabolic on PET-CT.

## 2014-04-27 MED ORDER — FLUTICASONE PROPIONATE 50 MCG/ACT NA SUSP: 2.0000 | Freq: Every day | NASAL | Status: DC

## 2014-04-27 MED ORDER — LEVOFLOXACIN 750 MG PO TABS: 750.0000 mg | ORAL_TABLET | Freq: Every day | ORAL | Status: DC

## 2014-04-27 NOTE — Telephone Encounter (Signed)
Reviewed CT scan results with her. She is completing a doxycycline course now. Will treat her with an additional 10 days with levafloxacin, then plan to repeat CT scan mid-October to see if the nodular infiltrate changes. If remains suspicious then will push for bx.

## 2014-05-18 ENCOUNTER — Encounter: Payer: Self-pay | Admitting: Emergency Medicine

## 2014-05-18 ENCOUNTER — Ambulatory Visit (INDEPENDENT_AMBULATORY_CARE_PROVIDER_SITE_OTHER): Payer: Medicare Other | Admitting: Emergency Medicine

## 2014-05-18 VITALS — BP 120/60 | HR 67 | Temp 98.0°F | Ht 63.0 in | Wt 116.2 lb

## 2014-05-18 DIAGNOSIS — R911 Solitary pulmonary nodule: Secondary | ICD-10-CM | POA: Insufficient documentation

## 2014-05-18 NOTE — Progress Notes (Signed)
Subjective:    Patient ID: [name] [name], female    DOB: 1934-08-28, 79 y.o.   MRN: [id number]  HPI 79 yo woman, never smoker, Hx of HTN, hyperlipidemia. Seen in the past her by Dr Melvyn Novas for chronic cough and bronchiectasis on CT scan of the chest.   She has chronic rhinitis, on loratadine and mucinex.  About 2 weeks ago she developed sore throat after exposed to her granddaughter who had URI. She had the same cough and mucous productive of green mucous during her viral illness, treated initially w azithro, then the green mucous better since going on levaquin 10 days ago (finished today). She still feels drained, may have improved some. The cough seemed to bother her the most at night during this URI, can bother her at any time during the day. She is on protonix, still has some breakthrough symptoms.   ROV 03/21/11 -- follow up cough, bronchiectasis on CT scan 07/21/02. Has had cough following rx for bronchitis as above with levofloxacin. Last time we added astepro to loratadine, doubled omeprazole x 2 weeks. She actually changed loratadine to zyrtec. She couldn't tolerate the astepro - stopped it due to facial pain. Her cough hasn't really changed, still coughing up green mucous.    ROV 05/02/11 -- cough, bronchiectasis on CT scan.  Her repeat CT scan 8/7 showed some progression of her bronchiectasis and some associated nodular disease, most consistent with MAIC. She is still having cough, green phlegm.   ER follow up (Bronchiectasis ) 03/06/14 --  Patient returns for a one-week followup for recent emergency room visit. Patient was seen in emergency room for hemoptysis and bronchiectasis, exacerbation. She was treated with antibiotics, with Cipro. Chest x-ray showed no acute infiltrate.. 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  She finiished Cipro yesterday, and is feeling better. No further hemoptysis .  She denies any fever, or chest pain,  orthopnea, PND, or leg swelling.   Not seen in ~3 yrs - says doing fine until last week.  Never went for CT chest as recommended.   ROV 04/18/14 -- hx cough, bronchiectasis as above. Last saw her in 2012, but she has been to the ED and then seen here by TP with cough productive of green, then some hemoptysis. At her baseline she coughs green mucous every day. She was treated w cipro and blood resolved. Now today she saw hemoptysis again. She has daily clear nasal gtt. Her last CT was 03/25/11, her FOB was 12/2010.   ROV 05/18/14 -- follow up visit for bronchiectasis, cough. Her CT chest from 9/8 showed a more lobulated nodular infiltrate. We added another 10 days of levaquin with plan to repeat her Ct chest mid October.  Her cough is better, still has some green productive cough. Her repeat scan is set for 06/06/14.       Objective:   Physical Exam Filed Vitals:   05/18/14 1132  BP: 120/60  Pulse: 67  Temp: 98 F (36.7 C)  TempSrc: Oral  Height: 5\' 3"  (1.6 m)  Weight: 116 lb 3.2 oz (52.708 kg)  SpO2: 98%   Gen: Pleasant, thin woman, in no distress,  normal affect  ENT: No lesions,  mouth clear,  oropharynx clear, mild nasal congestion  Neck: No JVD, no TMG, no carotid bruits, no stridor  Lungs: No use of accessory muscles, no dullness to percussion, diminished BS in bases   Cardiovascular: RRR, heart sounds normal, no murmur or gallops,  no peripheral edema  Musculoskeletal: No deformities, no cyanosis or clubbing  Neuro: alert, non focal  Skin: Warm, no lesions or rashes    Assessment & Plan:   Pulmonary nodule, left Bronchiectasis with a more prominent LLL nodule, suspect infectious but must consider other causes. Will repeat her CT 06/06/14 and then follow up. If the nodule persists then we will consider needle bx or FOB.

## 2014-05-18 NOTE — Patient Instructions (Signed)
Get your CT scan of the chest on 06/06/14 as planned.  We will follow up in the office after the Ct scan to review

## 2014-05-18 NOTE — Assessment & Plan Note (Signed)
Bronchiectasis with a more prominent LLL nodule, suspect infectious but must consider other causes. Will repeat her CT 06/06/14 and then follow up. If the nodule persists then we will consider needle bx or FOB.

## 2014-06-06 ENCOUNTER — Ambulatory Visit (INDEPENDENT_AMBULATORY_CARE_PROVIDER_SITE_OTHER)
Admission: RE | Admit: 2014-06-06 | Discharge: 2014-06-06 | Disposition: A | Discharge status: Home / Self Care | Payer: Medicare Other | Source: Ambulatory Visit | Attending: Emergency Medicine | Admitting: Emergency Medicine

## 2014-06-06 DIAGNOSIS — R911 Solitary pulmonary nodule: Secondary | ICD-10-CM

## 2014-06-09 ENCOUNTER — Ambulatory Visit (INDEPENDENT_AMBULATORY_CARE_PROVIDER_SITE_OTHER): Payer: Medicare Other | Admitting: Emergency Medicine

## 2014-06-09 ENCOUNTER — Encounter: Payer: Self-pay | Admitting: Emergency Medicine

## 2014-06-09 VITALS — BP 122/82 | HR 56 | Temp 98.3°F | Ht 63.0 in | Wt 116.8 lb

## 2014-06-09 DIAGNOSIS — R911 Solitary pulmonary nodule: Secondary | ICD-10-CM

## 2014-06-09 DIAGNOSIS — J309 Allergic rhinitis, unspecified: Secondary | ICD-10-CM

## 2014-06-09 DIAGNOSIS — Z720 Tobacco use: Secondary | ICD-10-CM

## 2014-06-09 DIAGNOSIS — J479 Bronchiectasis, uncomplicated: Secondary | ICD-10-CM

## 2014-06-09 NOTE — Progress Notes (Signed)
Subjective:    Patient ID: [name] [name], female    DOB: April 10, 1935, 79 y.o.   MRN: [id number]  HPI 79 yo woman, never smoker, Hx of HTN, hyperlipidemia. Seen in the past her by Dr Melvyn Novas for chronic cough and bronchiectasis on CT scan of the chest.   She has chronic rhinitis, on loratadine and mucinex.  About 2 weeks ago she developed sore throat after exposed to her granddaughter who had URI. She had the same cough and mucous productive of green mucous during her viral illness, treated initially w azithro, then the green mucous better since going on levaquin 10 days ago (finished today). She still feels drained, may have improved some. The cough seemed to bother her the most at night during this URI, can bother her at any time during the day. She is on protonix, still has some breakthrough symptoms.   ROV 03/21/11 -- follow up cough, bronchiectasis on CT scan 07/21/02. Has had cough following rx for bronchitis as above with levofloxacin. Last time we added astepro to loratadine, doubled omeprazole x 2 weeks. She actually changed loratadine to zyrtec. She couldn't tolerate the astepro - stopped it due to facial pain. Her cough hasn't really changed, still coughing up green mucous.    ROV 05/02/11 -- cough, bronchiectasis on CT scan.  Her repeat CT scan 8/7 showed some progression of her bronchiectasis and some associated nodular disease, most consistent with MAIC. She is still having cough, green phlegm.   ER follow up (Bronchiectasis ) 03/06/14 --  Patient returns for a one-week followup for recent emergency room visit. Patient was seen in emergency room for hemoptysis and bronchiectasis, exacerbation. She was treated with antibiotics, with Cipro. Chest x-ray showed no acute infiltrate.. 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  She finiished Cipro yesterday, and is feeling better. No further hemoptysis .  She denies any fever, or chest pain,  orthopnea, PND, or leg swelling.   Not seen in ~3 yrs - says doing fine until last week.  Never went for CT chest as recommended.   ROV 04/18/14 -- hx cough, bronchiectasis as above. Last saw her in 2012, but she has been to the ED and then seen here by TP with cough productive of green, then some hemoptysis. At her baseline she coughs green mucous every day. She was treated w cipro and blood resolved. Now today she saw hemoptysis again. She has daily clear nasal gtt. Her last CT was 03/25/11, her FOB was 12/2010.   ROV 05/18/14 -- follow up visit for bronchiectasis, cough. Her CT chest from 9/8 showed a more lobulated nodular infiltrate. We added another 10 days of levaquin with plan to repeat her Ct chest mid October.  Her cough is better, still has some green productive cough. Her repeat scan is set for 06/06/14.   ROV 06/09/14 -- follow up visit for bronchiectasis, cough.  She follows today for a more prominent LLL nodule. Repeat CT 10/20 >> the nodule in question is gone, but the chronic findings are still present. She is feeling well. Coughs up daily green phlegm.       Objective:   Physical Exam Filed Vitals:   06/09/14 1336  BP: 122/82  Pulse: 56  Temp: 98.3 F (36.8 C)  TempSrc: Oral  Height: 5\' 3"  (1.6 m)  Weight: 116 lb 12.8 oz (52.98 kg)  SpO2: 97%   Gen: Pleasant, thin woman, in no distress,  normal affect  ENT: No  lesions,  mouth clear,  oropharynx clear, mild nasal congestion  Neck: No JVD, no TMG, no carotid bruits, no stridor  Lungs: No use of accessory muscles, no dullness to percussion, diminished BS in bases   Cardiovascular: RRR, heart sounds normal, no murmur or gallops, no peripheral edema  Musculoskeletal: No deformities, no cyanosis or clubbing  Neuro: alert, non focal  Skin: Warm, no lesions or rashes    Assessment & Plan:   Pulmonary nodule, left Resolved on CT scan from 06/06/14, consistent with an inflammatory process.   Bronchiectasis without  acute exacerbation Stable at this time. At risk for bronchitis. Reviewed sx of exacerbation with her. Repeat her CT scan in 1 -2 years  Rhinitis fluticasone

## 2014-06-09 NOTE — Assessment & Plan Note (Signed)
Stable at this time. At risk for bronchitis. Reviewed sx of exacerbation with her. Repeat her CT scan in 1 -2 years

## 2014-06-09 NOTE — Patient Instructions (Signed)
Please continue your current medications as you are taking them  We will repeat your CT scan in 1 -2 years depending on how you are doing.  Call our office if your mucous production increases, if your mucous changes color, if you develop fever, chills, malaise. These could be symptoms of a bronchitis Follow with Dr Lamonte Sakai in 6 months or sooner if you have any problems

## 2014-06-09 NOTE — Assessment & Plan Note (Signed)
Resolved on CT scan from 06/06/14, consistent with an inflammatory process.

## 2014-06-09 NOTE — Assessment & Plan Note (Signed)
fluticasone

## 2014-06-19 ENCOUNTER — Other Ambulatory Visit: Payer: Self-pay

## 2014-06-19 DIAGNOSIS — Z1231 Encounter for screening mammogram for malignant neoplasm of breast: Secondary | ICD-10-CM

## 2014-07-04 ENCOUNTER — Ambulatory Visit
Admission: RE | Admit: 2014-07-04 | Discharge: 2014-07-04 | Disposition: A | Discharge status: Home / Self Care | Payer: Medicare Other | Source: Ambulatory Visit

## 2014-07-04 DIAGNOSIS — Z1231 Encounter for screening mammogram for malignant neoplasm of breast: Secondary | ICD-10-CM

## 2014-12-21 ENCOUNTER — Encounter: Payer: Self-pay | Admitting: Emergency Medicine

## 2014-12-21 ENCOUNTER — Ambulatory Visit (INDEPENDENT_AMBULATORY_CARE_PROVIDER_SITE_OTHER): Payer: Medicare Other | Admitting: Emergency Medicine

## 2014-12-21 VITALS — BP 132/74 | HR 76 | Wt 123.0 lb

## 2014-12-21 DIAGNOSIS — R053 Chronic cough: Secondary | ICD-10-CM

## 2014-12-21 DIAGNOSIS — R05 Cough: Secondary | ICD-10-CM | POA: Diagnosis not present

## 2014-12-21 DIAGNOSIS — J479 Bronchiectasis, uncomplicated: Secondary | ICD-10-CM

## 2014-12-21 DIAGNOSIS — K219 Gastro-esophageal reflux disease without esophagitis: Secondary | ICD-10-CM

## 2014-12-21 DIAGNOSIS — J309 Allergic rhinitis, unspecified: Secondary | ICD-10-CM | POA: Diagnosis not present

## 2014-12-21 NOTE — Progress Notes (Signed)
Subjective:    Patient ID: [name] [name], female    DOB: 06-Oct-1934, 80 y.o.   MRN: [id number]  HPI 80 yo woman, never smoker, Hx of HTN, hyperlipidemia. Seen in the past her by Dr Melvyn Novas for chronic cough and bronchiectasis on CT scan of the chest.   She has chronic rhinitis, on loratadine and mucinex.  About 2 weeks ago she developed sore throat after exposed to her granddaughter who had URI. She had the same cough and mucous productive of green mucous during her viral illness, treated initially w azithro, then the green mucous better since going on levaquin 10 days ago (finished today). She still feels drained, may have improved some. The cough seemed to bother her the most at night during this URI, can bother her at any time during the day. She is on protonix, still has some breakthrough symptoms.   ROV 03/21/11 -- follow up cough, bronchiectasis on CT scan 07/21/02. Has had cough following rx for bronchitis as above with levofloxacin. Last time we added astepro to loratadine, doubled omeprazole x 2 weeks. She actually changed loratadine to zyrtec. She couldn't tolerate the astepro - stopped it due to facial pain. Her cough hasn't really changed, still coughing up green mucous.    ROV 05/02/11 -- cough, bronchiectasis on CT scan.  Her repeat CT scan 8/7 showed some progression of her bronchiectasis and some associated nodular disease, most consistent with MAIC. She is still having cough, green phlegm.   ER follow up (Bronchiectasis ) 03/06/14 --  Patient returns for a one-week followup for recent emergency room visit. Patient was seen in emergency room for hemoptysis and bronchiectasis, exacerbation. She was treated with antibiotics, with Cipro. Chest x-ray showed no acute infiltrate.. 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  She finiished Cipro yesterday, and is feeling better. No further hemoptysis .  She denies any fever, or chest pain,  orthopnea, PND, or leg swelling.   Not seen in ~3 yrs - says doing fine until last week.  Never went for CT chest as recommended.   ROV 04/18/14 -- hx cough, bronchiectasis as above. Last saw her in 2012, but she has been to the ED and then seen here by TP with cough productive of green, then some hemoptysis. At her baseline she coughs green mucous every day. She was treated w cipro and blood resolved. Now today she saw hemoptysis again. She has daily clear nasal gtt. Her last CT was 03/25/11, her FOB was 12/2008.   ROV 05/18/14 -- follow up visit for bronchiectasis, cough. Her CT chest from 9/8 showed a more lobulated nodular infiltrate. We added another 10 days of levaquin with plan to repeat her Ct chest mid October.  Her cough is better, still has some green productive cough. Her repeat scan is set for 06/06/14.   ROV 06/09/14 -- follow up visit for bronchiectasis, cough.  She follows today for a more prominent LLL nodule. Repeat CT 10/20 >> the nodule in question is gone, but the chronic findings are still present. She is feeling well. Coughs up daily green phlegm.   ROV 12/21/14 -- follow-up visit for cough and bronchiectasis. She had a reassuring CT scan of the chest in October 2015. She has continued to have cough, usually green. She is on mucinex-D. She has run out of fluticasone nasal spray.  She remains on protonix. She is on loratadine. She has no dyspnea, CP, wheeze.      Objective:  Physical Exam Filed Vitals:   12/21/14 1452  BP: 132/74  Pulse: 76  Weight: 123 lb (55.792 kg)  SpO2: 97%   Gen: Pleasant, thin woman, in no distress,  normal affect  ENT: No lesions,  mouth clear,  oropharynx clear, mild nasal congestion  Neck: No JVD, no TMG, no carotid bruits, no stridor  Lungs: No use of accessory muscles, no dullness to percussion, diminished BS in bases   Cardiovascular: RRR, heart sounds normal, no murmur or gallops, no peripheral edema  Musculoskeletal: No deformities, no  cyanosis or clubbing  Neuro: alert, non focal  Skin: Warm, no lesions or rashes    Assessment & Plan:   Chronic cough Still occurring. Same amount of mucus. I do not believe that her bronchiectasis is flaring or his changed. We will attempt to manage her rhinitis and GERD   Rhinitis Continue loratadine and nasal saline. We will add back her fluticasone nasal spray   GERD (gastroesophageal reflux disease) Continue pantoprazole   Bronchiectasis without acute exacerbation We will discuss when to repeat her CT scan of the chest at our next visit or sooner if she has any decline in her function

## 2014-12-21 NOTE — Assessment & Plan Note (Signed)
We will discuss when to repeat her CT scan of the chest at our next visit or sooner if she has any decline in her function

## 2014-12-21 NOTE — Patient Instructions (Signed)
Please continue all your medications as you have been taking them Restart fluticasone nasal spray, 2 sprays each nostril twice a day Follow with Dr Lamonte Sakai in 12 months or sooner if you have any problems. We will decide at that time when to repeat her CT scan of your chest

## 2014-12-21 NOTE — Assessment & Plan Note (Signed)
Still occurring. Same amount of mucus. I do not believe that her bronchiectasis is flaring or his changed. We will attempt to manage her rhinitis and GERD

## 2014-12-21 NOTE — Assessment & Plan Note (Signed)
Continue pantoprazole. °

## 2014-12-21 NOTE — Assessment & Plan Note (Signed)
Continue loratadine and nasal saline. We will add back her fluticasone nasal spray

## 2014-12-29 ENCOUNTER — Ambulatory Visit (INDEPENDENT_AMBULATORY_CARE_PROVIDER_SITE_OTHER)
Admission: RE | Admit: 2014-12-29 | Discharge: 2014-12-29 | Disposition: A | Discharge status: Home / Self Care | Payer: Medicare Other | Source: Ambulatory Visit | Attending: Pulmonary Disease | Admitting: Pulmonary Disease

## 2014-12-29 ENCOUNTER — Ambulatory Visit (INDEPENDENT_AMBULATORY_CARE_PROVIDER_SITE_OTHER): Payer: Medicare Other | Admitting: Pulmonary Disease

## 2014-12-29 ENCOUNTER — Encounter: Payer: Self-pay | Admitting: Pulmonary Disease

## 2014-12-29 VITALS — BP 122/70 | HR 82 | Temp 97.3°F | Wt 123.8 lb

## 2014-12-29 DIAGNOSIS — J471 Bronchiectasis with (acute) exacerbation: Secondary | ICD-10-CM | POA: Insufficient documentation

## 2014-12-29 DIAGNOSIS — R042 Hemoptysis: Secondary | ICD-10-CM | POA: Diagnosis not present

## 2014-12-29 MED ORDER — LEVOFLOXACIN 750 MG PO TABS: 750.0000 mg | ORAL_TABLET | Freq: Every day | ORAL | Status: DC

## 2014-12-29 MED ORDER — METHYLPREDNISOLONE 4 MG PO TBPK: ORAL_TABLET | ORAL | Status: DC

## 2014-12-29 NOTE — Progress Notes (Signed)
Subjective:     Patient ID: [name] [name], female   DOB: 06/27/1935, 80 y.o.   MRN: [id number]  HPI 80 yo woman, never smoker, Hx of HTN, hyperlipidemia. Seen in the past by Dr Melvyn Novas for chronic cough and bronchiectasis on CT scan of the chest.   02/18/11> Initial OV w/ RB> She has chronic rhinitis, on loratadine and mucinex.  About 2 weeks ago she developed sore throat after exposed to her granddaughter who had URI. She had the same cough and mucous productive of green mucous during her viral illness, treated initially w azithro, then the green mucous better since going on levaquin 10 days ago (finished today). She still feels drained, may have improved some. The cough seemed to bother her the most at night during this URI, can bother her at any time during the day. She is on protonix, still has some breakthrough symptoms.   ROV 03/21/11 -- follow up cough, bronchiectasis on CT scan 07/21/02. Has had cough following rx for bronchitis as above with levofloxacin. Last time we added astepro to loratadine, doubled omeprazole x 2 weeks. She actually changed loratadine to zyrtec. She couldn't tolerate the astepro - stopped it due to facial pain. Her cough hasn't really changed, still coughing up green mucous.    ROV 05/02/11 -- cough, bronchiectasis on CT scan.  Her repeat CT scan 8/7 showed some progression of her bronchiectasis and some associated nodular disease, most consistent with MAIC. She is still having cough, green phlegm.   ER follow up (Bronchiectasis ) 03/06/14 --  Patient returns for a one-week followup for recent emergency room visit. Patient was seen in emergency room for hemoptysis and bronchiectasis, exacerbation. She was treated with antibiotics, with Cipro. Chest x-ray showed no acute infiltrate.. 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  She finiished Cipro yesterday, and is feeling better. No further hemoptysis .  She denies any fever, or  chest pain, orthopnea, PND, or leg swelling.   Not seen in ~3 yrs - says doing fine until last week.  Never went for CT chest as recommended.   ROV 04/18/14 -- hx cough, bronchiectasis as above. Last saw her in 2012, but she has been to the ED and then seen here by TP with cough productive of green, then some hemoptysis. At her baseline she coughs green mucous every day. She was treated w cipro and blood resolved. Now today she saw hemoptysis again. She has daily clear nasal gtt. Her last CT was 03/25/11, her FOB was 12/2008.   ROV 05/18/14 -- follow up visit for bronchiectasis, cough. Her CT chest from 9/8 showed a more lobulated nodular infiltrate. We added another 10 days of levaquin with plan to repeat her Ct chest mid October.  Her cough is better, still has some green productive cough. Her repeat scan is set for 06/06/14.   ROV 06/09/14 -- follow up visit for bronchiectasis, cough.  She follows today for a more prominent LLL nodule. Repeat CT 10/20 >> the nodule in question is gone, but the chronic findings are still present. She is feeling well. Coughs up daily green phlegm.   ROV 12/21/14 -- follow-up visit for cough and bronchiectasis. She had a reassuring CT scan of the chest in October 2015. She has continued to have cough, usually green. She is on mucinex-D. She has run out of fluticasone nasal spray.  She remains on protonix. She is on loratadine. She has no dyspnea, CP, wheeze.   ~  Dec 29, 2014:  Add-on appt w/ SN for hemoptysis>>  58 WF followed by RB w/ hx Bronchiectasis, chr cough, AR, GERD- on Claritin, flonase, Mucinex,Protonix; she was actually seen by RB 1wk ago (see above) & no changes made; presents w/ episode of hemoptysis- ~1tsp BRB superimposed on her chr cough, green sput, but denies f/c/s, no CP, no SOB; she exercises on treadmill 30-9min daily w/o difficulty & denies any recent deterioration in exercise tolerance;  She has been on Cipro & Levaquin in the past, no antibiotics  required since 2015, r3view of Epic- no cultures since 2012 Bronchoscopy (yeast c/w Candida)...      EXAM shows Afeb, VSS, O2sat=96% on RA;  HEENT- neg x ear wax, mallampati2;  Chest- few scat rhonchi, no wheezes/ rales/ signs of consolidation;  Cor- RR gr1/6 SEM w/o rubs or gallops;  Ext- w/o c/c/e...  CXR 12/29/14 showed heart at upper lim od norm size, hyperinflated, bilat bronchiectasis, NAD...   IMP/PLAN>>  She was unable to produce any sput for [place] in the office today;  We decided to treat w/ LEVAQUIN750/d x7d (plus Align daily while on the antibiotic), and MEDROL Dosepak;  She will continue the OTC Mucinex, fluids and add OTC DELSYM as needed... She will f/u w/ DrByrum as planned...    Past Medical History  Diagnosis Date  . Hypertension >> on Atenolol25, Ziac5-6.25...   . Hypercholesteremia >> on Simva40     No past surgical history on file.   Outpatient Encounter Prescriptions as of 12/29/2014  Medication Sig  . acetaminophen (TYLENOL) 500 MG tablet Take 500 mg by mouth every 6 (six) hours as needed for mild pain.   Marland Kitchen atenolol (TENORMIN) 25 MG tablet Take 25 mg by mouth daily.  . bisoprolol-hydrochlorothiazide (ZIAC) 5-6.25 MG per tablet Take 1 tablet by mouth daily. Once daily  . calcium citrate-vitamin D 200-200 MG-UNIT TABS Take 1 tablet by mouth daily.   . fluticasone (FLONASE) 50 MCG/ACT nasal spray Place 2 sprays into both nostrils daily.  Marland Kitchen loratadine (CLARITIN) 10 MG tablet Take 10 mg by mouth daily.  . pantoprazole (PROTONIX) 40 MG tablet Take 40 mg by mouth daily.  . pseudoephedrine-guaifenesin (MUCINEX D) 60-600 MG per tablet Take 1 tablet by mouth daily.    . simvastatin (ZOCOR) 40 MG tablet Take 40 mg by mouth daily. Once daily    No Known Allergies   Current Medications, Allergies, Past Medical History, Past Surgical History, Family History, and Social History were reviewed in [employer] record.   Review of Systems          All  symptoms NEG except where BOLDED >>  Constitutional:  F/C/S, fatigue, anorexia, unexpected weight change. HEENT:  HA, visual changes, hearing loss, earache, nasal symptoms, sore throat, mouth sores, hoarseness. Resp:  cough, sputum, hemoptysis; SOB, tightness, wheezing. Cardio:  CP, palpit, DOE, orthopnea, edema. GI:  N/V/D/C, blood in stool; reflux, abd pain, distention, gas. GU:  dysuria, freq, urgency, hematuria, flank pain, voiding difficulty. MS:  joint pain, swelling, tenderness, decr ROM; neck pain, back pain, etc. Neuro:  HA, tremors, seizures, dizziness, syncope, weakness, numbness, gait abn. Skin:  suspicious lesions or skin rash. Heme:  adenopathy, bruising, bleeding. Psyche:  confusion, agitation, sleep disturbance, hallucinations, anxiety, depression suicidal.   Objective:   Physical Exam    Filed Vitals:   12/29/14 1152  BP: 122/70  Pulse: 82  Temp: 97.3 F (36.3 C)  TempSrc: Oral  Weight: 123 lb 12.8 oz (56.155 kg)  SpO2: 96%   Gen: Pleasant, thin woman, in no distress, normal affect ENT: No lesions, mouth clear, oropharynx clear, mild nasal congestion Neck: No JVD, no TMG, no carotid bruits, no stridor Lungs: No use of accessory muscles, no dullness to percussion, diminished BS in bases, few scat rhonchi Cardiovascular: RRR, heart sounds normal, no murmur or gallops, no peripheral edema Musculoskeletal: No deformities, no cyanosis or clubbing Neuro: alert, non focal Skin: Warm, no lesions or rashes   Assessment:      IMP >>  Hx bronchiectasis w/ exacerbation Hemoptysis  PLAN >>  She was unable to produce any sput for [place] in the office today;  We decided to treat w/ LEVAQUIN750/d x7d (plus Align daily while on the antibiotic), and MEDROL Dosepak;  She will continue the OTC Mucinex, fluids and add OTC DELSYM as needed... She will f/u w/ DrByrum as planned.     Plan:     Patient's Medications  New Prescriptions   LEVOFLOXACIN (LEVAQUIN) 750 MG TABLET     Take 1 tablet (750 mg total) by mouth daily.   METHYLPREDNISOLONE (MEDROL DOSEPAK) 4 MG TBPK TABLET    Use as directed  Previous Medications   ACETAMINOPHEN (TYLENOL) 500 MG TABLET    Take 500 mg by mouth every 6 (six) hours as needed for mild pain.    ATENOLOL (TENORMIN) 25 MG TABLET    Take 25 mg by mouth daily.   BISOPROLOL-HYDROCHLOROTHIAZIDE (ZIAC) 5-6.25 MG PER TABLET    Take 1 tablet by mouth daily. Once daily   CALCIUM CITRATE-VITAMIN D 200-200 MG-UNIT TABS    Take 1 tablet by mouth daily.    FLUTICASONE (FLONASE) 50 MCG/ACT NASAL SPRAY    Place 2 sprays into both nostrils daily.   LORATADINE (CLARITIN) 10 MG TABLET    Take 10 mg by mouth daily.   PANTOPRAZOLE (PROTONIX) 40 MG TABLET    Take 40 mg by mouth daily.   PSEUDOEPHEDRINE-GUAIFENESIN (MUCINEX D) 60-600 MG PER TABLET    Take 1 tablet by mouth daily.     SIMVASTATIN (ZOCOR) 40 MG TABLET    Take 40 mg by mouth daily. Once daily  Modified Medications   No medications on file  Discontinued Medications   No medications on file

## 2014-12-29 NOTE — Patient Instructions (Signed)
Today we updated your med list in our EPIC system...    Continue your current medications the same...  For your bronchiectasis and coughing up the blood>>    Start the [facility] antibiotic- 750mg , take one tab daily til gone...    Be sure to take an OTC Probiotic like ALIGN daily while you are on the Levaquin...    We are also prescribing a MEDROL DOSEPAK to reduce your bronchial tube inflammation-       Take as directed on the pack...  Call for any questions...  Keep your regularly planned follow up visit w/ DrByrum.Marland KitchenMarland Kitchen

## 2015-03-22 ENCOUNTER — Telehealth: Payer: Self-pay | Admitting: Emergency Medicine

## 2015-03-22 ENCOUNTER — Ambulatory Visit (INDEPENDENT_AMBULATORY_CARE_PROVIDER_SITE_OTHER)
Admission: RE | Admit: 2015-03-22 | Discharge: 2015-03-22 | Disposition: A | Discharge status: Home / Self Care | Payer: Medicare Other | Source: Ambulatory Visit | Attending: Internal Medicine | Admitting: Internal Medicine

## 2015-03-22 DIAGNOSIS — R042 Hemoptysis: Secondary | ICD-10-CM

## 2015-03-22 MED ORDER — LEVOFLOXACIN 750 MG PO TABS: 750.0000 mg | ORAL_TABLET | Freq: Every day | ORAL | Status: DC

## 2015-03-22 MED ORDER — METHYLPREDNISOLONE 4 MG PO TBPK: ORAL_TABLET | ORAL | Status: DC

## 2015-03-22 NOTE — Telephone Encounter (Signed)
Spoke with pt, aware of the below recs.  cxr ordered, meds sent to verified pharmacy, scheduled ov next available with RB.  Nothing further needed at this time .

## 2015-03-22 NOTE — Telephone Encounter (Signed)
Suggest we do what helped before. Recommend outpatient CXR for dx hemoptysis NOS, then repeat the scripts for augmentin and medrol dospak he gave in May. . She needs ov f/u with Dr Lamonte Sakai.

## 2015-03-22 NOTE — Telephone Encounter (Signed)
Atc, line rang several times and went to fast busy signal.  Wcb.

## 2015-03-22 NOTE — Telephone Encounter (Signed)
Spoke with pt, states she has been coughing up dark red blood (no mucus per pt) since last night.  States she's coughed up about 1 teaspoon of blood since last night.  Pt notes no recent physical trauma.  Denies fever, chest pain, sinus congestion.  Pt states this happened to her back in May.  Pt saw SN for this, had a cxr, was given augmentin and medrol dosepak.  No open appt slots today with any provider in the office.    Sending to doc of day as RB is out.    Dr. Annamaria Boots please advise on recs.  Thanks!  No Known Allergies  Current Outpatient Prescriptions on File Prior to Visit  Medication Sig Dispense Refill  . acetaminophen (TYLENOL) 500 MG tablet Take 500 mg by mouth every 6 (six) hours as needed for mild pain.     Marland Kitchen atenolol (TENORMIN) 25 MG tablet Take 25 mg by mouth daily.    . bisoprolol-hydrochlorothiazide (ZIAC) 5-6.25 MG per tablet Take 1 tablet by mouth daily. Once daily    . calcium citrate-vitamin D 200-200 MG-UNIT TABS Take 1 tablet by mouth daily.     . fluticasone (FLONASE) 50 MCG/ACT nasal spray Place 2 sprays into both nostrils daily. 16 g 6  . levofloxacin (LEVAQUIN) 750 MG tablet Take 1 tablet (750 mg total) by mouth daily. 7 tablet 0  . loratadine (CLARITIN) 10 MG tablet Take 10 mg by mouth daily.    . methylPREDNISolone (MEDROL DOSEPAK) 4 MG TBPK tablet Use as directed 21 tablet 0  . pantoprazole (PROTONIX) 40 MG tablet Take 40 mg by mouth daily.    . pseudoephedrine-guaifenesin (MUCINEX D) 60-600 MG per tablet Take 1 tablet by mouth daily.      . simvastatin (ZOCOR) 40 MG tablet Take 40 mg by mouth daily. Once daily     No current facility-administered medications on file prior to visit.

## 2015-04-03 ENCOUNTER — Encounter: Payer: Self-pay | Admitting: Emergency Medicine

## 2015-04-03 ENCOUNTER — Ambulatory Visit (INDEPENDENT_AMBULATORY_CARE_PROVIDER_SITE_OTHER): Payer: Medicare Other | Admitting: Emergency Medicine

## 2015-04-03 VITALS — BP 128/70 | HR 60 | Wt 125.0 lb

## 2015-04-03 DIAGNOSIS — J309 Allergic rhinitis, unspecified: Secondary | ICD-10-CM | POA: Diagnosis not present

## 2015-04-03 DIAGNOSIS — R059 Cough, unspecified: Secondary | ICD-10-CM

## 2015-04-03 DIAGNOSIS — R042 Hemoptysis: Secondary | ICD-10-CM | POA: Diagnosis not present

## 2015-04-03 DIAGNOSIS — R05 Cough: Secondary | ICD-10-CM

## 2015-04-03 NOTE — Assessment & Plan Note (Signed)
Add fluticasone to Mucinex and loratadine as above

## 2015-04-03 NOTE — Assessment & Plan Note (Signed)
PFTs as above

## 2015-04-03 NOTE — Patient Instructions (Signed)
We will arrange for a bronchoscopy to evaluate your airways for source of bleeding We will repeat your CT scan of the chest to compare with October 2015 We will perform full pulmonary function testing Follow with Dr Lamonte Sakai in 1 month

## 2015-04-03 NOTE — Assessment & Plan Note (Signed)
Continues to have chronic cough. She has had episodic hemoptysis over the last month and a half that is responded twice to antibiotics and corticosteroids. Her allergic rhinitis is treated with loratadine and Mucinex D. We'll add fluticasone nasal spray. She needs bronchoscopy and a repeat CT scan of the chest To assess for nodular disease and a source of bleeding. I will also perform pulmonary function testing given her refractory cough

## 2015-04-03 NOTE — Progress Notes (Signed)
Subjective:    Patient ID: [name] [name], female    DOB: 1934/11/02, 80 y.o.   MRN: [id number]  HPI 80 yo woman, never smoker, Hx of HTN, hyperlipidemia. Seen in the past her by Dr Melvyn Novas for chronic cough and bronchiectasis on CT scan of the chest.   She has chronic rhinitis, on loratadine and mucinex.  About 2 weeks ago she developed sore throat after exposed to her granddaughter who had URI. She had the same cough and mucous productive of green mucous during her viral illness, treated initially w azithro, then the green mucous better since going on levaquin 10 days ago (finished today). She still feels drained, may have improved some. The cough seemed to bother her the most at night during this URI, can bother her at any time during the day. She is on protonix, still has some breakthrough symptoms.   ROV 03/21/11 -- follow up cough, bronchiectasis on CT scan 07/21/02. Has had cough following rx for bronchitis as above with levofloxacin. Last time we added astepro to loratadine, doubled omeprazole x 2 weeks. She actually changed loratadine to zyrtec. She couldn't tolerate the astepro - stopped it due to facial pain. Her cough hasn't really changed, still coughing up green mucous.    ROV 05/02/11 -- cough, bronchiectasis on CT scan.  Her repeat CT scan 8/7 showed some progression of her bronchiectasis and some associated nodular disease, most consistent with MAIC. She is still having cough, green phlegm.   ER follow up (Bronchiectasis ) 03/06/14 --  Patient returns for a one-week followup for recent emergency room visit. Patient was seen in emergency room for hemoptysis and bronchiectasis, exacerbation. She was treated with antibiotics, with Cipro. Chest x-ray showed no acute infiltrate.. 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  She finiished Cipro yesterday, and is feeling better. No further hemoptysis .  She denies any fever, or chest pain,  orthopnea, PND, or leg swelling.   Not seen in ~3 yrs - says doing fine until last week.  Never went for CT chest as recommended.   ROV 04/18/14 -- hx cough, bronchiectasis as above. Last saw her in 2012, but she has been to the ED and then seen here by TP with cough productive of green, then some hemoptysis. At her baseline she coughs green mucous every day. She was treated w cipro and blood resolved. Now today she saw hemoptysis again. She has daily clear nasal gtt. Her last CT was 03/25/11, her FOB was 12/2010.   ROV 05/18/14 -- follow up visit for bronchiectasis, cough. Her CT chest from 9/8 showed a more lobulated nodular infiltrate. We added another 10 days of levaquin with plan to repeat her Ct chest mid October.  Her cough is better, still has some green productive cough. Her repeat scan is set for 06/06/14.   ROV 06/09/14 -- follow up visit for bronchiectasis, cough.  She follows today for a more prominent LLL nodule. Repeat CT 10/20 >> the nodule in question is gone, but the chronic findings are still present. She is feeling well. Coughs up daily green phlegm.   ROV 12/21/14 -- follow-up visit for cough and bronchiectasis. She had a reassuring CT scan of the chest in October 2015. She has continued to have cough, usually green. She is on mucinex-D. She has run out of fluticasone nasal spray.  She remains on protonix. She is on loratadine. She has no dyspnea, CP, wheeze.   ROV 04/03/15 -- Follow-up  visit for bronchiectasis and associated chronic cough. We will also followed some waxing and waning micronodular disease by CT scan of the chest, most recent 05/2014. She reports today that she was experiencing stable cough, more phlegm, and hemoptysis beginning of this month. She received abx and steroids and her cough and bleeding resolved. CXR 03/22/15 was reviewed by me, showed RML and lingular scar. She denies any epistaxis. Now she is coughing again but no more hemoptysis. Not on BD's. Not febrile. No  aches, URI sx. Remains on mucinex-D, loratdine. She is not on fluticasone NS.      Objective:   Physical Exam Filed Vitals:   04/03/15 1435 04/03/15 1437  BP:  128/70  Pulse:  60  Weight: 125 lb (56.7 kg)   SpO2:  94%   Gen: Pleasant, thin woman, in no distress,  normal affect  ENT: No lesions,  mouth clear,  oropharynx clear, mild nasal congestion  Neck: No JVD, no TMG, no carotid bruits, no stridor  Lungs: No use of accessory muscles, no dullness to percussion, diminished BS in bases   Cardiovascular: RRR, heart sounds normal, no murmur or gallops, no peripheral edema  Musculoskeletal: No deformities, no cyanosis or clubbing  Neuro: alert, non focal  Skin: Warm, no lesions or rashes    Assessment & Plan:   Bronchiectasis without acute exacerbation Continues to have chronic cough. She has had episodic hemoptysis over the last month and a half that is responded twice to antibiotics and corticosteroids. Her allergic rhinitis is treated with loratadine and Mucinex D. We'll add fluticasone nasal spray. She needs bronchoscopy and a repeat CT scan of the chest To assess for nodular disease and a source of bleeding. I will also perform pulmonary function testing given her refractory cough  Chronic cough PFTs as above  Rhinitis Add fluticasone to Mucinex and loratadine as above

## 2015-04-05 ENCOUNTER — Ambulatory Visit (INDEPENDENT_AMBULATORY_CARE_PROVIDER_SITE_OTHER)
Admission: RE | Admit: 2015-04-05 | Discharge: 2015-04-05 | Disposition: A | Discharge status: Home / Self Care | Payer: Medicare Other | Source: Ambulatory Visit | Attending: Emergency Medicine | Admitting: Emergency Medicine

## 2015-04-05 DIAGNOSIS — R042 Hemoptysis: Secondary | ICD-10-CM

## 2015-04-05 DIAGNOSIS — R05 Cough: Secondary | ICD-10-CM

## 2015-04-05 DIAGNOSIS — R059 Cough, unspecified: Secondary | ICD-10-CM

## 2015-04-16 ENCOUNTER — Encounter (HOSPITAL_COMMUNITY): Payer: Medicare Other

## 2015-04-16 ENCOUNTER — Ambulatory Visit (HOSPITAL_COMMUNITY)
Admission: RE | Admit: 2015-04-16 | Discharge: 2015-04-16 | Disposition: A | Discharge status: Home / Self Care | Payer: Medicare Other | Source: Ambulatory Visit | Attending: Emergency Medicine | Admitting: Emergency Medicine

## 2015-04-16 ENCOUNTER — Encounter (HOSPITAL_COMMUNITY): Payer: Self-pay | Admitting: Radiology

## 2015-04-16 ENCOUNTER — Encounter (HOSPITAL_COMMUNITY)
Admission: RE | Disposition: A | Discharge status: Home / Self Care | Payer: Self-pay | Source: Ambulatory Visit | Attending: Emergency Medicine

## 2015-04-16 DIAGNOSIS — J479 Bronchiectasis, uncomplicated: Secondary | ICD-10-CM

## 2015-04-16 DIAGNOSIS — R042 Hemoptysis: Secondary | ICD-10-CM | POA: Diagnosis not present

## 2015-04-16 DIAGNOSIS — I10 Essential (primary) hypertension: Secondary | ICD-10-CM | POA: Insufficient documentation

## 2015-04-16 DIAGNOSIS — J310 Chronic rhinitis: Secondary | ICD-10-CM | POA: Diagnosis not present

## 2015-04-16 DIAGNOSIS — E785 Hyperlipidemia, unspecified: Secondary | ICD-10-CM | POA: Diagnosis not present

## 2015-04-16 DIAGNOSIS — R05 Cough: Secondary | ICD-10-CM | POA: Insufficient documentation

## 2015-04-16 DIAGNOSIS — R053 Chronic cough: Secondary | ICD-10-CM | POA: Diagnosis present

## 2015-04-16 HISTORY — DX: Cardiac murmur, unspecified: R01.1

## 2015-04-16 HISTORY — PX: VIDEO BRONCHOSCOPY: SHX5072

## 2015-04-16 LAB — BODY FLUID CELL COUNT WITH DIFFERENTIAL
EOS FL: 0 %
LYMPHS FL: 4 %
MONOCYTE-MACROPHAGE-SEROUS FLUID: 10 % — AB (ref 50–90)
Neutrophil Count, Fluid: 86 % — ABNORMAL HIGH (ref 0–25)
Total Nucleated Cell Count, Fluid: 770 cu mm (ref 0–1000)

## 2015-04-16 SURGERY — VIDEO BRONCHOSCOPY WITHOUT FLUORO
Anesthesia: Moderate Sedation | Laterality: Bilateral

## 2015-04-16 MED ORDER — MIDAZOLAM HCL 10 MG/2ML IJ SOLN
INTRAMUSCULAR | Status: DC | PRN
  Administered 2015-04-16 (×2): 1 mg via INTRAVENOUS
  Administered 2015-04-16: 2 mg via INTRAVENOUS

## 2015-04-16 MED ORDER — FENTANYL CITRATE (PF) 100 MCG/2ML IJ SOLN
INTRAMUSCULAR | Status: AC
  Filled 2015-04-16: qty 4

## 2015-04-16 MED ORDER — FENTANYL CITRATE (PF) 100 MCG/2ML IJ SOLN
INTRAMUSCULAR | Status: DC | PRN
  Administered 2015-04-16 (×3): 25 ug via INTRAVENOUS
  Administered 2015-04-16: 50 ug via INTRAVENOUS

## 2015-04-16 MED ORDER — MIDAZOLAM HCL 5 MG/ML IJ SOLN
INTRAMUSCULAR | Status: AC
  Filled 2015-04-16: qty 2

## 2015-04-16 MED ORDER — LIDOCAINE HCL 2 % EX GEL: 1.0000 "application " | Freq: Once | CUTANEOUS | Status: DC

## 2015-04-16 MED ORDER — SODIUM CHLORIDE 0.9 % IV SOLN
INTRAVENOUS | Status: DC
  Administered 2015-04-16: 06:00:00 via INTRAVENOUS

## 2015-04-16 MED ORDER — LIDOCAINE HCL 2 % EX GEL
CUTANEOUS | Status: DC | PRN
  Administered 2015-04-16: 1

## 2015-04-16 MED ORDER — PHENYLEPHRINE HCL 0.25 % NA SOLN: 1.0000 | Freq: Four times a day (QID) | NASAL | Status: DC | PRN

## 2015-04-16 MED ORDER — PHENYLEPHRINE HCL 0.25 % NA SOLN
NASAL | Status: DC | PRN
  Administered 2015-04-16: 2 via NASAL

## 2015-04-16 MED ORDER — LIDOCAINE HCL (PF) 1 % IJ SOLN
INTRAMUSCULAR | Status: DC | PRN
  Administered 2015-04-16: 5 mL

## 2015-04-16 NOTE — Progress Notes (Signed)
04/16/2015- Video bronchoscopy done  Intervention- bronch washing done Intervention- bronchial brushing done Pt tolerated well- no complications noted.

## 2015-04-16 NOTE — Op Note (Signed)
Video Bronchoscopy Procedure Note  Date of Operation: 04/16/2015  Pre-op Diagnosis: cough, hemoptysis, bronchiectasis  Post-op Diagnosis: Same  Surgeon: Baltazar Apo  Assistants: none  Anesthesia: conscious sedation, moderate sedation  Meds Given: fentanyl 176mcg, versed 4mg  in divided doses, 1% lidocaine 20cc total  Operation: Flexible video fiberoptic bronchoscopy and biopsies.  Estimated Blood Loss: None  Complications: none noted  Indications and History: [name] [name] is 80 y.o. with history of chronic cough, bronchiectasis, transient hemoptysis. She has nodular disease in an inflammatory pattern suspicious for [facility].  Recommendation was to perform video fiberoptic bronchoscopy. The risks, benefits, complications, treatment options and expected outcomes were discussed with the patient.  The possibilities of pneumothorax, pneumonia, reaction to medication, pulmonary aspiration, perforation of a viscus, bleeding, failure to diagnose a condition and creating a complication requiring transfusion or operation were discussed with the patient who freely signed the consent.    Description of Procedure: The patient was seen in the Preoperative Area, was examined and was deemed appropriate to proceed.  The patient was taken to [facility], identified as [name] [name] and the procedure verified as Flexible Video Fiberoptic Bronchoscopy.  A Time Out was held and the above information confirmed.   Conscious sedation was initiated as indicated above. The video fiberoptic bronchoscope was introduced via the L nare and a general inspection was performed which showed cobblestoning of the posterior pharynx and to a small degree the epiglottis. Inspection after lidocaine anesthesia showed normal cords, normal trachea, normal main carina. There were thin, white milky secretions noted bilaterally. The R sided airways were inspected and showed normal RUL, BI, RML and RLL. No endobronchial lesions  were seen. The L side was then inspected. The lingular was slightly narrowed but distal inspection was possible revealing normal segmental airways. The airways were very irritable and sensitive to suctioning. Just light suctioning on the L resulted in widespread petechial bleeding. Given the petechiae the LUL bronchus was more difficult to assess and was suggestive of some irregular raised bronchial wall. For this reason the LUL carina was brushed for cytology. The LLL airway was normal with exception of the suction-related irritation. BAL was performed in the lingula for cytology and in the RUL to be sent for cytology, cell count and microbiology. The patient tolerated the procedure well. The bronchoscope was removed. There were no obvious complications.   Samples: 1. Endobronchial brushings from LUL carina 2. BAL from the lingula for cytology 3. BAL from the RUL for cytology, cell count and cultures.   Plans:  We will review the cytology, microbiology results with the patient when they become available.  Outpatient followup will be with Dr Lamonte Sakai.    Baltazar Apo, MD, PhD 04/16/2015, 8:18 AM [facility] [phone] or if no answer [phone]

## 2015-04-16 NOTE — Discharge Instructions (Addendum)
Flexible Bronchoscopy, Care After These instructions give you information on caring for yourself after your procedure. Your doctor may also give you more specific instructions. Call your doctor if you have any problems or questions after your procedure. HOME CARE  Do not eat or drink anything for 2 hours after your procedure. If you try to eat or drink before the medicine wears off, food or drink could go into your lungs. You could also burn yourself.  After 2 hours have passed and when you can cough and gag normally, you may eat soft food and drink liquids slowly.  The day after the test, you may eat your normal diet.  You may do your normal activities.  Keep all doctor visits. GET HELP RIGHT AWAY IF:  You get more and more short of breath.  You get light-headed.  You feel like you are going to pass out (faint).  You have chest pain.  You have new problems that worry you.  You cough up more than a little blood.  You cough up more blood than before. MAKE SURE YOU:  Understand these instructions.  Will watch your condition.  Will get help right away if you are not doing well or get worse. Document Released: 06/01/2009 Document Revised: 08/09/2013 Document Reviewed: 04/08/2013 [facility] Patient Information 2015 [place], [place]. This information is not intended to replace advice given to you by your health care provider. Make sure you discuss any questions you have with your health care provider.  Nothing to eat or drink until   10:30am          Today 04/16/2015  Please call our office for any questions or concerns. [phone]

## 2015-04-16 NOTE — H&P (View-Only) (Signed)
Subjective:    Patient ID: [name] [name], female    DOB: 04/22/1935, 80 y.o.   MRN: [id number]  HPI 80 yo woman, never smoker, Hx of HTN, hyperlipidemia. Seen in the past her by Dr Wert for chronic cough and bronchiectasis on CT scan of the chest.   She has chronic rhinitis, on loratadine and mucinex.  About 2 weeks ago she developed sore throat after exposed to her granddaughter who had URI. She had the same cough and mucous productive of green mucous during her viral illness, treated initially w azithro, then the green mucous better since going on levaquin 10 days ago (finished today). She still feels drained, may have improved some. The cough seemed to bother her the most at night during this URI, can bother her at any time during the day. She is on protonix, still has some breakthrough symptoms.   ROV 03/21/11 -- follow up cough, bronchiectasis on CT scan 07/21/02. Has had cough following rx for bronchitis as above with levofloxacin. Last time we added astepro to loratadine, doubled omeprazole x 2 weeks. She actually changed loratadine to zyrtec. She couldn't tolerate the astepro - stopped it due to facial pain. Her cough hasn't really changed, still coughing up green mucous.    ROV 05/02/11 -- cough, bronchiectasis on CT scan.  Her repeat CT scan 8/7 showed some progression of her bronchiectasis and some associated nodular disease, most consistent with MAIC. She is still having cough, green phlegm.   ER follow up (Bronchiectasis ) 03/06/14 --  Patient returns for a one-week followup for recent emergency room visit. Patient was seen in emergency room for hemoptysis and bronchiectasis, exacerbation. She was treated with antibiotics, with Cipro. Chest x-ray showed no acute infiltrate.. 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  She finiished Cipro yesterday, and is feeling better. No further hemoptysis .  She denies any fever, or chest pain,  orthopnea, PND, or leg swelling.   Not seen in ~3 yrs - says doing fine until last week.  Never went for CT chest as recommended.   ROV 04/18/14 -- hx cough, bronchiectasis as above. Last saw her in 2012, but she has been to the ED and then seen here by TP with cough productive of green, then some hemoptysis. At her baseline she coughs green mucous every day. She was treated w cipro and blood resolved. Now today she saw hemoptysis again. She has daily clear nasal gtt. Her last CT was 03/25/11, her FOB was 12/2010.   ROV 05/18/14 -- follow up visit for bronchiectasis, cough. Her CT chest from 9/8 showed a more lobulated nodular infiltrate. We added another 10 days of levaquin with plan to repeat her Ct chest mid October.  Her cough is better, still has some green productive cough. Her repeat scan is set for 06/06/14.   ROV 06/09/14 -- follow up visit for bronchiectasis, cough.  She follows today for a more prominent LLL nodule. Repeat CT 10/20 >> the nodule in question is gone, but the chronic findings are still present. She is feeling well. Coughs up daily green phlegm.   ROV 12/21/14 -- follow-up visit for cough and bronchiectasis. She had a reassuring CT scan of the chest in October 2015. She has continued to have cough, usually green. She is on mucinex-D. She has run out of fluticasone nasal spray.  She remains on protonix. She is on loratadine. She has no dyspnea, CP, wheeze.   ROV 04/03/15 -- Follow-up   visit for bronchiectasis and associated chronic cough. We will also followed some waxing and waning micronodular disease by CT scan of the chest, most recent 05/2014. She reports today that she was experiencing stable cough, more phlegm, and hemoptysis beginning of this month. She received abx and steroids and her cough and bleeding resolved. CXR 03/22/15 was reviewed by me, showed RML and lingular scar. She denies any epistaxis. Now she is coughing again but no more hemoptysis. Not on BD's. Not febrile. No  aches, URI sx. Remains on mucinex-D, loratdine. She is not on fluticasone NS.      Objective:   Physical Exam Filed Vitals:   04/03/15 1435 04/03/15 1437  BP:  128/70  Pulse:  60  Weight: 125 lb (56.7 kg)   SpO2:  94%   Gen: Pleasant, thin woman, in no distress,  normal affect  ENT: No lesions,  mouth clear,  oropharynx clear, mild nasal congestion  Neck: No JVD, no TMG, no carotid bruits, no stridor  Lungs: No use of accessory muscles, no dullness to percussion, diminished BS in bases   Cardiovascular: RRR, heart sounds normal, no murmur or gallops, no peripheral edema  Musculoskeletal: No deformities, no cyanosis or clubbing  Neuro: alert, non focal  Skin: Warm, no lesions or rashes    Assessment & Plan:   Bronchiectasis without acute exacerbation Continues to have chronic cough. She has had episodic hemoptysis over the last month and a half that is responded twice to antibiotics and corticosteroids. Her allergic rhinitis is treated with loratadine and Mucinex D. We'll add fluticasone nasal spray. She needs bronchoscopy and a repeat CT scan of the chest To assess for nodular disease and a source of bleeding. I will also perform pulmonary function testing given her refractory cough  Chronic cough PFTs as above  Rhinitis Add fluticasone to Mucinex and loratadine as above          

## 2015-04-16 NOTE — Interval H&P Note (Signed)
PCCM Interval Note  [name] [name] presents for further eval of her cough and hemoptysis Underwent Ct chest as below - shows nodular disease superimposed on bronchiectasis. There is a new more solid LLL nodule. In sum the picture is most consistent with Mycobacterial disease. Need to also r/o malignancy, endobronchial lesion. She presents for FOB.   Filed Vitals:   04/16/15 0730 04/16/15 0735 04/16/15 0740 04/16/15 0745  BP: 213/67 217/70 182/61 193/69  Pulse: 69 74 77 75  Temp:      TempSrc:      Resp: 21 24 21 18   Height:      Weight:      SpO2: 100% 100% 100% 100%     Gen: Pleasant, well-nourished, in no distress,  normal affect  ENT: No lesions,  mouth clear,  oropharynx clear, no postnasal drip  Lungs: No use of accessory muscles, clear without rales or rhonchi  Cardiovascular: RRR, heart sounds normal, no murmur or gallops, no peripheral edema  Musculoskeletal: No deformities, no cyanosis or clubbing    04/05/15 --  COMPARISON: CT 06/06/2014  FINDINGS: Mediastinum/Nodes: No axillary or supraclavicular lymphadenopathy. No mediastinal hilar adenopathy. Coronary calcifications are present. No pericardial fluid. Esophagus is normal.  Lungs/Pleura: Again demonstrated bronchial wall thickening and cylindrical and varicose bronchiectasis primarily in the upper lobes. There is atelectasis within the lingula and RIGHT middle lobe similar to prior. Mild micro nodularity associated with this bronchiectasis pattern.  Within the LEFT lower lobe there is increased density of a previously seen ground-glass opacity. This now solid nodule measures 9 mm on image 33, series 3.  RIGHT upper lobe nodule along the fissure measures 6 mm is unchanged.  Upper abdomen: Limited view of the liver, kidneys, pancreas are unremarkable. Normal adrenal glands.  Musculoskeletal: No aggressive osseous lesion.  IMPRESSION: 1. New solid nodule in the LEFT lower lobe at site of  prior ground-glass opacity with differential including a focus of infection versus neoplasm. Patient has a history of waxing and waning nodules therefore recommend short-term CT follow-up (1 month) and if lesion persists recommend FDG PET scan or biopsy. 2. Stable bronchiectasis and micro nodularity in the upper lobes most suggestive of chronic infection ( MIA or APBA)).   A/P:  Bronchiectasis with chronic cough, nodular disease, hemoptysis  FOB today. Discussed procedure w pt, risks and benefits. She is prepared to proceed.   Baltazar Apo, MD, PhD 04/16/2015, 7:52 AM [facility] [phone] or if no answer [phone]

## 2015-04-17 ENCOUNTER — Encounter (HOSPITAL_COMMUNITY): Payer: Self-pay | Admitting: Emergency Medicine

## 2015-04-18 LAB — CULTURE, BAL-QUANTITATIVE W GRAM STAIN: Colony Count: 75000

## 2015-04-18 LAB — CULTURE, BAL-QUANTITATIVE

## 2015-04-25 ENCOUNTER — Telehealth: Payer: Self-pay | Admitting: Emergency Medicine

## 2015-04-25 NOTE — Progress Notes (Signed)
Quick Note:  Called and spoke with pt. Reviewed results and recs. Pt voiced understanding and had no further questions. ______ 

## 2015-04-25 NOTE — Telephone Encounter (Signed)
Returned pt's call. Reviewed results and recs per RB  Notes Recorded by Collene Gobble, MD on 04/24/2015 at 5:47 PM Please let her know that all of her cultures and cytologies are negative so far. We can discuss further at her office visit  Pt voiced understanding and had no further questions Nothing further needed

## 2015-05-10 ENCOUNTER — Ambulatory Visit (INDEPENDENT_AMBULATORY_CARE_PROVIDER_SITE_OTHER): Payer: Medicare Other | Admitting: Emergency Medicine

## 2015-05-10 ENCOUNTER — Encounter: Payer: Self-pay | Admitting: Emergency Medicine

## 2015-05-10 VITALS — BP 126/72 | HR 70 | Ht 62.0 in | Wt 124.0 lb

## 2015-05-10 DIAGNOSIS — R059 Cough, unspecified: Secondary | ICD-10-CM

## 2015-05-10 DIAGNOSIS — R042 Hemoptysis: Secondary | ICD-10-CM | POA: Diagnosis not present

## 2015-05-10 DIAGNOSIS — J479 Bronchiectasis, uncomplicated: Secondary | ICD-10-CM | POA: Diagnosis not present

## 2015-05-10 DIAGNOSIS — R05 Cough: Secondary | ICD-10-CM

## 2015-05-10 LAB — PULMONARY FUNCTION TEST
DL/VA % pred: 98 %
DL/VA: 4.49 ml/min/mmHg/L
DLCO UNC % PRED: 74 %
DLCO unc: 16.05 ml/min/mmHg
FEF 25-75 POST: 0.89 L/s
FEF 25-75 PRE: 1.07 L/s
FEF2575-%CHANGE-POST: -16 %
FEF2575-%PRED-POST: 64 %
FEF2575-%PRED-PRE: 77 %
FEV1-%Change-Post: -2 %
FEV1-%Pred-Post: 84 %
FEV1-%Pred-Pre: 86 %
FEV1-Post: 1.52 L
FEV1-Pre: 1.56 L
FEV1FVC-%CHANGE-POST: 0 %
FEV1FVC-%Pred-Pre: 97 %
FEV6-%CHANGE-POST: -3 %
FEV6-%Pred-Post: 90 %
FEV6-%Pred-Pre: 93 %
FEV6-PRE: 2.17 L
FEV6-Post: 2.09 L
FEV6FVC-%Change-Post: 0 %
FEV6FVC-%Pred-Post: 104 %
FEV6FVC-%Pred-Pre: 105 %
FVC-%CHANGE-POST: -2 %
FVC-%PRED-POST: 86 %
FVC-%Pred-Pre: 88 %
FVC-Post: 2.11 L
FVC-Pre: 2.17 L
POST FEV1/FVC RATIO: 72 %
PRE FEV1/FVC RATIO: 72 %
Post FEV6/FVC ratio: 99 %
Pre FEV6/FVC Ratio: 100 %
RV % pred: 110 %
RV: 2.49 L
TLC % pred: 96 %
TLC: 4.57 L

## 2015-05-10 MED ORDER — ALBUTEROL SULFATE HFA 108 (90 BASE) MCG/ACT IN AERS: 2.0000 | INHALATION_SPRAY | Freq: Four times a day (QID) | RESPIRATORY_TRACT | Status: DC | PRN

## 2015-05-10 NOTE — Progress Notes (Signed)
PFT done today. 

## 2015-05-10 NOTE — Progress Notes (Signed)
Subjective:    Patient ID: [name] [name], female    DOB: 1934/11/02, 80 y.o.   MRN: [id number]  HPI 80 yo woman, never smoker, Hx of HTN, hyperlipidemia. Seen in the past her by Dr Melvyn Novas for chronic cough and bronchiectasis on CT scan of the chest.   She has chronic rhinitis, on loratadine and mucinex.  About 2 weeks ago she developed sore throat after exposed to her granddaughter who had URI. She had the same cough and mucous productive of green mucous during her viral illness, treated initially w azithro, then the green mucous better since going on levaquin 10 days ago (finished today). She still feels drained, may have improved some. The cough seemed to bother her the most at night during this URI, can bother her at any time during the day. She is on protonix, still has some breakthrough symptoms.   ROV 03/21/11 -- follow up cough, bronchiectasis on CT scan 07/21/02. Has had cough following rx for bronchitis as above with levofloxacin. Last time we added astepro to loratadine, doubled omeprazole x 2 weeks. She actually changed loratadine to zyrtec. She couldn't tolerate the astepro - stopped it due to facial pain. Her cough hasn't really changed, still coughing up green mucous.    ROV 05/02/11 -- cough, bronchiectasis on CT scan.  Her repeat CT scan 8/7 showed some progression of her bronchiectasis and some associated nodular disease, most consistent with MAIC. She is still having cough, green phlegm.   ER follow up (Bronchiectasis ) 03/06/14 --  Patient returns for a one-week followup for recent emergency room visit. Patient was seen in emergency room for hemoptysis and bronchiectasis, exacerbation. She was treated with antibiotics, with Cipro. Chest x-ray showed no acute infiltrate.. 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  She finiished Cipro yesterday, and is feeling better. No further hemoptysis .  She denies any fever, or chest pain,  orthopnea, PND, or leg swelling.   Not seen in ~3 yrs - says doing fine until last week.  Never went for CT chest as recommended.   ROV 04/18/14 -- hx cough, bronchiectasis as above. Last saw her in 2012, but she has been to the ED and then seen here by TP with cough productive of green, then some hemoptysis. At her baseline she coughs green mucous every day. She was treated w cipro and blood resolved. Now today she saw hemoptysis again. She has daily clear nasal gtt. Her last CT was 03/25/11, her FOB was [birth year]/2010.   ROV 05/18/14 -- follow up visit for bronchiectasis, cough. Her CT chest from 9/8 showed a more lobulated nodular infiltrate. We added another 10 days of levaquin with plan to repeat her Ct chest mid October.  Her cough is better, still has some green productive cough. Her repeat scan is set for 06/06/14.   ROV 06/09/14 -- follow up visit for bronchiectasis, cough.  She follows today for a more prominent LLL nodule. Repeat CT 10/20 >> the nodule in question is gone, but the chronic findings are still present. She is feeling well. Coughs up daily green phlegm.   ROV 12/21/14 -- follow-up visit for cough and bronchiectasis. She had a reassuring CT scan of the chest in October 2015. She has continued to have cough, usually green. She is on mucinex-D. She has run out of fluticasone nasal spray.  She remains on protonix. She is on loratadine. She has no dyspnea, CP, wheeze.   ROV 04/03/15 -- Follow-up  visit for bronchiectasis and associated chronic cough. We will also followed some waxing and waning micronodular disease by CT scan of the chest, most recent 05/2014. She reports today that she was experiencing stable cough, more phlegm, and hemoptysis beginning of this month. She received abx and steroids and her cough and bleeding resolved. CXR 03/22/15 was reviewed by me, showed RML and lingular scar. She denies any epistaxis. Now she is coughing again but no more hemoptysis. Not on BD's. Not febrile. No  aches, URI sx. Remains on mucinex-D, loratdine. She is not on fluticasone NS.   ROV 05/10/15 -- follow-up visit for chronic cough and bronchiectasis. She has had some waxing and waning micronodular disease on CT scan of the chest which prompted a repeat scan on 04/05/15. I have reviewed this scan personally, shows persistence of her bilateral upper lobe predominant bronchiectasis. One of her left mid lung nodules has a more solid appearance on the scan then on her prior from 06/06/14.we performed bronchoscopy on 04/16/15, culture showed normal flora and the AFB and fungal cultures are still negative to date.  Her cough is better. No more hemoptysis.      Objective:   Physical Exam Filed Vitals:   05/10/15 1328  BP: 126/72  Pulse: 70  Height: 5\' 2"  (1.575 m)  Weight: 124 lb (56.246 kg)  SpO2: 97%   Gen: Pleasant, thin woman, in no distress,  normal affect  ENT: No lesions,  mouth clear,  oropharynx clear, mild nasal congestion  Neck: No JVD, no TMG, no carotid bruits, no stridor  Lungs: No use of accessory muscles, no dullness to percussion, diminished BS in bases   Cardiovascular: RRR, heart sounds normal, no murmur or gallops, no peripheral edema  Musculoskeletal: No deformities, no cyanosis or clubbing  Neuro: alert, non focal  Skin: Warm, no lesions or rashes    Assessment & Plan:   Bronchiectasis without acute exacerbation With micronodular disease on CT scan of the chest. She has a left mid lung nodule that is more solid now than it was on her prior CT scan. I still suspect that this is inflammatory or infectious. Plan to follow it with serial scans over time as we follow her bronchiectasis. I believe she needs an Aspergillus IgG to evaluate for possible ABPA. Based on her pulmonary function testing which shows some curve to the flow-volume loop and potentially some mild obstruction we will do a trial of albuterol. I suspect she may have an asthmatic type phenotype associated  with her bronchiectasis. We will do a trial of albuterol to see if she benefits particularly with cough

## 2015-05-10 NOTE — Assessment & Plan Note (Signed)
With micronodular disease on CT scan of the chest. She has a left mid lung nodule that is more solid now than it was on her prior CT scan. I still suspect that this is inflammatory or infectious. Plan to follow it with serial scans over time as we follow her bronchiectasis. I believe she needs an Aspergillus IgG to evaluate for possible ABPA. Based on her pulmonary function testing which shows some curve to the flow-volume loop and potentially some mild obstruction we will do a trial of albuterol. I suspect she may have an asthmatic type phenotype associated with her bronchiectasis. We will do a trial of albuterol to see if she benefits particularly with cough

## 2015-05-10 NOTE — Patient Instructions (Addendum)
We will perform blood work today We will try using albuterol 2 puffs if needed for cough, wheeze or shortness of breath.  We will follow for the final results of your bronchoscopy cultures Continue your loratadine (Claritin) Start fluticasone nasal spray, 2 sprays each nostril daily.  Follow with Dr Lamonte Sakai in 1 months or sooner if you have any problems.

## 2015-05-14 LAB — FUNGUS CULTURE W SMEAR: FUNGAL SMEAR: NONE SEEN

## 2015-05-23 ENCOUNTER — Telehealth: Payer: Self-pay | Admitting: Emergency Medicine

## 2015-05-23 NOTE — Telephone Encounter (Signed)
Will route message to RB per MR's request.

## 2015-05-23 NOTE — Telephone Encounter (Signed)
Bronchoalveolar large shows mycobacterium avium complex; this is not a critical result. This a chronic disease. Please forward to  Dr. Dr. Lamonte Sakai

## 2015-05-23 NOTE — Telephone Encounter (Signed)
Received critical lab call report from [facility] Stated that pt had bronchial lavage that came back positive for microbacterium/avium Will route to DOD since Dr Lamonte Sakai is out of office  Dr Romilda Garret, please advise. Thanks

## 2015-05-25 NOTE — Telephone Encounter (Signed)
Please let her know that her cultures have shown one of the opportunistic infections that we had discussed and suspected might be present > mycobacterium avium (or MAIC). We need to discuss treating this with an antibiotic plan that lasts several months. We should set up OV to discuss and plan. Thanks.

## 2015-05-25 NOTE — Telephone Encounter (Signed)
Pt is aware of results. OV has been scheduled for 06/01/15 at 1:30pm. Nothing further was needed.

## 2015-06-01 ENCOUNTER — Encounter: Payer: Self-pay | Admitting: Emergency Medicine

## 2015-06-01 ENCOUNTER — Ambulatory Visit (INDEPENDENT_AMBULATORY_CARE_PROVIDER_SITE_OTHER): Payer: Medicare Other | Admitting: Emergency Medicine

## 2015-06-01 ENCOUNTER — Telehealth: Payer: Self-pay | Admitting: Emergency Medicine

## 2015-06-01 VITALS — BP 144/72 | HR 70 | Wt 123.0 lb

## 2015-06-01 DIAGNOSIS — J479 Bronchiectasis, uncomplicated: Secondary | ICD-10-CM

## 2015-06-01 MED ORDER — AZITHROMYCIN 500 MG PO TABS: ORAL_TABLET | ORAL | Status: DC

## 2015-06-01 MED ORDER — CLARITHROMYCIN 500 MG PO TABS: 1000.0000 mg | ORAL_TABLET | ORAL | Status: DC

## 2015-06-01 MED ORDER — ETHAMBUTOL HCL 400 MG PO TABS: 800.0000 mg | ORAL_TABLET | ORAL | Status: DC

## 2015-06-01 MED ORDER — RIFAMPIN 300 MG PO CAPS: 600.0000 mg | ORAL_CAPSULE | ORAL | Status: DC

## 2015-06-01 NOTE — Assessment & Plan Note (Addendum)
She has benefited from albuterol with less cough and less sputum, consistent with her mild obstruction on her flow volume loop. Her sputum has grown back a bacterium avium . We discussed the pros and cons of treating today and I have recommended a 3 drug regimen for at least 6-9 months. She is agreeable to this. I'll continue her albuterol for now and we can consider starting a long-acting bronchodilator at some point in the future depending on how she does. If we clear her mycobacterium that it may be she will need a bronchodilator at all  Please continue to use your albuterol inhaler 2 puffs as needed for cough or shortness of breath.  We will start antibiotics for Mycobacterium avium:  - Clarithromycin 1000 mg daily for 3 days out of the week - ethambutol 800 mg daily for 3 days out of the week - rifampin 600 mg daily for 3 days out of the week Follow with Dr Lamonte Sakai in 3 months or sooner if you have any problems.

## 2015-06-01 NOTE — Patient Instructions (Signed)
Please continue to use your albuterol inhaler 2 puffs as needed for cough or shortness of breath.  We will start antibiotics for Mycobacterium avium:  - Clarithromycin 1000 mg daily for 3 days out of the week - ethambutol 800 mg daily for 3 days out of the week - rifampin 600 mg daily for 3 days out of the week Follow with Dr Lamonte Sakai in 3 months or sooner if you have any problems.

## 2015-06-01 NOTE — Telephone Encounter (Signed)
Called and notified pt of medication change Pt voiced understanding of instructions  New medication sent electronically to pt's pharmacy  Nothing further is needed at this time.

## 2015-06-01 NOTE — Telephone Encounter (Signed)
Per RB >> Change Clarithromycin to Azithromycin 500mg  daily three times a week

## 2015-06-01 NOTE — Progress Notes (Signed)
Subjective:    Patient ID: [name] [name], female    DOB: 1934/11/02, 80 y.o.   MRN: [id number]  HPI 80 yo woman, never smoker, Hx of HTN, hyperlipidemia. Seen in the past her by Dr Melvyn Novas for chronic cough and bronchiectasis on CT scan of the chest.   She has chronic rhinitis, on loratadine and mucinex.  About 2 weeks ago she developed sore throat after exposed to her granddaughter who had URI. She had the same cough and mucous productive of green mucous during her viral illness, treated initially w azithro, then the green mucous better since going on levaquin 10 days ago (finished today). She still feels drained, may have improved some. The cough seemed to bother her the most at night during this URI, can bother her at any time during the day. She is on protonix, still has some breakthrough symptoms.   ROV 03/21/11 -- follow up cough, bronchiectasis on CT scan 07/21/02. Has had cough following rx for bronchitis as above with levofloxacin. Last time we added astepro to loratadine, doubled omeprazole x 2 weeks. She actually changed loratadine to zyrtec. She couldn't tolerate the astepro - stopped it due to facial pain. Her cough hasn't really changed, still coughing up green mucous.    ROV 05/02/11 -- cough, bronchiectasis on CT scan.  Her repeat CT scan 8/7 showed some progression of her bronchiectasis and some associated nodular disease, most consistent with MAIC. She is still having cough, green phlegm.   ER follow up (Bronchiectasis ) 03/06/14 --  Patient returns for a one-week followup for recent emergency room visit. Patient was seen in emergency room for hemoptysis and bronchiectasis, exacerbation. She was treated with antibiotics, with Cipro. Chest x-ray showed no acute infiltrate.. 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  She finiished Cipro yesterday, and is feeling better. No further hemoptysis .  She denies any fever, or chest pain,  orthopnea, PND, or leg swelling.   Not seen in ~3 yrs - says doing fine until last week.  Never went for CT chest as recommended.   ROV 04/18/14 -- hx cough, bronchiectasis as above. Last saw her in 2012, but she has been to the ED and then seen here by TP with cough productive of green, then some hemoptysis. At her baseline she coughs green mucous every day. She was treated w cipro and blood resolved. Now today she saw hemoptysis again. She has daily clear nasal gtt. Her last CT was 03/25/11, her FOB was 12/2010.   ROV 05/18/14 -- follow up visit for bronchiectasis, cough. Her CT chest from 9/8 showed a more lobulated nodular infiltrate. We added another 10 days of levaquin with plan to repeat her Ct chest mid October.  Her cough is better, still has some green productive cough. Her repeat scan is set for 06/06/14.   ROV 06/09/14 -- follow up visit for bronchiectasis, cough.  She follows today for a more prominent LLL nodule. Repeat CT 10/20 >> the nodule in question is gone, but the chronic findings are still present. She is feeling well. Coughs up daily green phlegm.   ROV 12/21/14 -- follow-up visit for cough and bronchiectasis. She had a reassuring CT scan of the chest in October 2015. She has continued to have cough, usually green. She is on mucinex-D. She has run out of fluticasone nasal spray.  She remains on protonix. She is on loratadine. She has no dyspnea, CP, wheeze.   ROV 04/03/15 -- Follow-up  visit for bronchiectasis and associated chronic cough. We will also followed some waxing and waning micronodular disease by CT scan of the chest, most recent 05/2014. She reports today that she was experiencing stable cough, more phlegm, and hemoptysis beginning of this month. She received abx and steroids and her cough and bleeding resolved. CXR 03/22/15 was reviewed by me, showed RML and lingular scar. She denies any epistaxis. Now she is coughing again but no more hemoptysis. Not on BD's. Not febrile. No  aches, URI sx. Remains on mucinex-D, loratdine. She is not on fluticasone NS.   ROV 05/10/15 -- follow-up visit for chronic cough and bronchiectasis. She has had some waxing and waning micronodular disease on CT scan of the chest which prompted a repeat scan on 04/05/15. I have reviewed this scan personally, shows persistence of her bilateral upper lobe predominant bronchiectasis. One of her left mid lung nodules has a more solid appearance on the scan then on her prior from 06/06/14.we performed bronchoscopy on 04/16/15, culture showed normal flora and the AFB and fungal cultures are still negative to date.  Her cough is better. No more hemoptysis.   ROV 06/01/15 -- follow-up visit for bronchiectasis and chronic cough. She underwent bronchoscopy on 04/16/15 and AFB was positive for Mycobacterium avium. She presents today to discuss possible treatment.  She has albuterol and has started using it BID as it is helping her. Note mild obstruction on her spirometry. She has stopped mucinex. Her cough is much better. Less cough and less sputum.       Objective:   Physical Exam Filed Vitals:   06/01/15 1333  BP: 144/72  Pulse: 70  Weight: 123 lb (55.792 kg)  SpO2: 95%   Gen: Pleasant, thin woman, in no distress,  normal affect  ENT: No lesions,  mouth clear,  oropharynx clear, mild nasal congestion  Neck: No JVD, no TMG, no carotid bruits, no stridor  Lungs: No use of accessory muscles, no dullness to percussion, diminished BS in bases   Cardiovascular: RRR, heart sounds normal, no murmur or gallops, no peripheral edema  Musculoskeletal: No deformities, no cyanosis or clubbing  Neuro: alert, non focal  Skin: Warm, no lesions or rashes    Assessment & Plan:   Bronchiectasis without acute exacerbation She has benefited from albuterol with less cough and less sputum, consistent with her mild obstruction on her flow volume loop. Her sputum has grown back a bacterium avium . We discussed the pros  and cons of treating today and I have recommended a 3 drug regimen for at least 6-9 months. She is agreeable to this. I'll continue her albuterol for now and we can consider starting a long-acting bronchodilator at some point in the future depending on how she does. If we clear her mycobacterium that it may be she will need a bronchodilator at all  Please continue to use your albuterol inhaler 2 puffs as needed for cough or shortness of breath.  We will start antibiotics for Mycobacterium avium:  - Clarithromycin 1000 mg daily for 3 days out of the week - ethambutol 800 mg daily for 3 days out of the week - rifampin 600 mg daily for 3 days out of the week Follow with Dr Lamonte Sakai in 3 months or sooner if you have any problems.

## 2015-06-01 NOTE — Telephone Encounter (Signed)
RB, pharmacy calling regarding RX for  (Clarithromycin)  Biaxin. Pharmacy claims that this medication has a "major interaction" with Simvastatin.

## 2015-06-04 LAB — AFB CULTURE WITH SMEAR (NOT AT ARMC): ACID FAST SMEAR: NONE SEEN

## 2015-06-15 ENCOUNTER — Ambulatory Visit: Payer: Medicare Other | Admitting: Emergency Medicine

## 2015-08-31 DIAGNOSIS — H25813 Combined forms of age-related cataract, bilateral: Secondary | ICD-10-CM | POA: Diagnosis not present

## 2015-08-31 DIAGNOSIS — H25819 Combined forms of age-related cataract, unspecified eye: Secondary | ICD-10-CM | POA: Diagnosis not present

## 2015-09-04 ENCOUNTER — Ambulatory Visit (INDEPENDENT_AMBULATORY_CARE_PROVIDER_SITE_OTHER): Payer: PPO | Admitting: Emergency Medicine

## 2015-09-04 ENCOUNTER — Encounter: Payer: Self-pay | Admitting: Emergency Medicine

## 2015-09-04 VITALS — BP 122/70 | HR 63 | Ht 63.5 in | Wt 122.0 lb

## 2015-09-04 DIAGNOSIS — J309 Allergic rhinitis, unspecified: Secondary | ICD-10-CM | POA: Diagnosis not present

## 2015-09-04 DIAGNOSIS — J479 Bronchiectasis, uncomplicated: Secondary | ICD-10-CM

## 2015-09-04 DIAGNOSIS — A310 Pulmonary mycobacterial infection: Secondary | ICD-10-CM

## 2015-09-04 MED ORDER — FLUTICASONE PROPIONATE 50 MCG/ACT NA SUSP: 2.0000 | Freq: Every day | NASAL | Status: DC

## 2015-09-04 MED ORDER — CLARITHROMYCIN 500 MG PO TABS: 1000.0000 mg | ORAL_TABLET | ORAL | Status: DC

## 2015-09-04 MED ORDER — ALBUTEROL SULFATE HFA 108 (90 BASE) MCG/ACT IN AERS: 2.0000 | INHALATION_SPRAY | Freq: Four times a day (QID) | RESPIRATORY_TRACT | Status: DC | PRN

## 2015-09-04 MED ORDER — ETHAMBUTOL HCL 400 MG PO TABS: 800.0000 mg | ORAL_TABLET | ORAL | Status: DC

## 2015-09-04 MED ORDER — RIFAMPIN 300 MG PO CAPS: 600.0000 mg | ORAL_CAPSULE | ORAL | Status: DC

## 2015-09-04 NOTE — Assessment & Plan Note (Signed)
Grossly stable although she continues to cough. Some of this is nasal gtt, but also a contribution from [facility]

## 2015-09-04 NOTE — Assessment & Plan Note (Addendum)
Has been On loratadine, recently changed to allegra. Has been on nasal steroid in the past but not  Currently

## 2015-09-04 NOTE — Addendum Note (Signed)
Addended by: Desmond Dike C on: 09/04/2015 02:24 PM   Modules accepted: Orders

## 2015-09-04 NOTE — Patient Instructions (Addendum)
We will restart antibiotics for Mycobacterium avium:  - Clarithromycin 1000 mg daily for 3 days out of the week - ethambutol 800 mg daily for 3 days out of the week - rifampin 600 mg daily for 3 days out of the week You will need to be on these medications (antibiotics) for several months Continue Allegra once a day Restart fluticasone nasal spray, 2 sprays each nostril once a day Restart albuterol inhaler, 2 puffs up to every 4 hours if needed for shortness of breath Follow with Dr Lamonte Sakai in 3 months or sooner if you have any problems.

## 2015-09-04 NOTE — Progress Notes (Signed)
Subjective:    Patient ID: [name] [name], female    DOB: 21-Apr-1935, 81 y.o.   MRN: [id number]  HPI 81 yo woman, never smoker, Hx of HTN, hyperlipidemia. Seen in the past her by Dr Melvyn Novas for chronic cough and bronchiectasis on CT scan of the chest.    ROV 12/21/14 -- follow-up visit for cough and bronchiectasis. She had a reassuring CT scan of the chest in October 2015. She has continued to have cough, usually green. She is on mucinex-D. She has run out of fluticasone nasal spray.  She remains on protonix. She is on loratadine. She has no dyspnea, CP, wheeze.   ROV 04/03/15 -- Follow-up visit for bronchiectasis and associated chronic cough. We will also followed some waxing and waning micronodular disease by CT scan of the chest, most recent 05/2014. She reports today that she was experiencing stable cough, more phlegm, and hemoptysis beginning of this month. She received abx and steroids and her cough and bleeding resolved. CXR 03/22/15 was reviewed by me, showed RML and lingular scar. She denies any epistaxis. Now she is coughing again but no more hemoptysis. Not on BD's. Not febrile. No aches, URI sx. Remains on mucinex-D, loratdine. She is not on fluticasone NS.   ROV 05/10/15 -- follow-up visit for chronic cough and bronchiectasis. She has had some waxing and waning micronodular disease on CT scan of the chest which prompted a repeat scan on 04/05/15. I have reviewed this scan personally, shows persistence of her bilateral upper lobe predominant bronchiectasis. One of her left mid lung nodules has a more solid appearance on the scan then on her prior from 06/06/14.we performed bronchoscopy on 04/16/15, culture showed normal flora and the AFB and fungal cultures are still negative to date.  Her cough is better. No more hemoptysis.   ROV 06/01/15 -- follow-up visit for bronchiectasis and chronic cough. She underwent bronchoscopy on 04/16/15 and AFB was positive for Mycobacterium avium. She presents today to  discuss possible treatment.  She has albuterol and has started using it BID as it is helping her. Note mild obstruction on her spirometry. She has stopped mucinex. Her cough is much better. Less cough and less sputum.    ROV 09/04/15 -- follow-up visit for Mycobacterium avium and associated bronchiectasis with chronic cough.  Last time we started clarithromycin, ethambutol, rifampin with the intention of treating for several months. She took these temporarily, for a week only, but did not realize she was to continue. She has been coughing, probably more for the last several days, has had nasal drip and a sore throat. She wonders if she should be back on albuterol. Just changed her loratadine to allegra. Not on NS, unsure whether she wants to restart,      Objective:   Physical Exam Filed Vitals:   09/04/15 1356 09/04/15 1357  BP:  122/70  Pulse:  63  Height: 5' 3.5" (1.613 m)   Weight: 122 lb (55.339 kg)   SpO2:  96%   Gen: Pleasant, thin woman, in no distress,  normal affect  ENT: No lesions,  mouth clear,  oropharynx clear, mild nasal congestion  Neck: No JVD, no TMG, no carotid bruits, no stridor  Lungs: No use of accessory muscles, no dullness to percussion, diminished BS in bases   Cardiovascular: RRR, heart sounds normal, no murmur or gallops, no peripheral edema  Musculoskeletal: No deformities, no cyanosis or clubbing  Neuro: alert, non focal  Skin: Warm, no lesions or rashes  Assessment & Plan:   Bronchiectasis without acute exacerbation Grossly stable although she continues to cough. Some of this is nasal gtt, but also a contribution from [facility]  Rhinitis Has been On loratadine, recently changed to allegra. Has been on nasal steroid in the past but not  Currently   Mycobacterium avium infection ([place]) We will restart antibiotics for Mycobacterium avium:  - Clarithromycin 1000 mg daily for 3 days out of the week - ethambutol 800 mg daily for 3 days out of the  week - rifampin 600 mg daily for 3 days out of the week

## 2015-09-04 NOTE — Assessment & Plan Note (Signed)
We will restart antibiotics for Mycobacterium avium:  - Clarithromycin 1000 mg daily for 3 days out of the week - ethambutol 800 mg daily for 3 days out of the week - rifampin 600 mg daily for 3 days out of the week

## 2015-09-11 ENCOUNTER — Telehealth: Payer: Self-pay | Admitting: Emergency Medicine

## 2015-09-11 NOTE — Telephone Encounter (Signed)
Atc pt, line was answered with a lot of static. I asked "Hello?" several times.  Line went dead. atc back, fast busy signal.  Wcb.

## 2015-09-11 NOTE — Telephone Encounter (Signed)
Is she taking meds with food. Take with food.  Add probiotic daily  May try gas x with meals.  If she can not tolerate , will need to stop and let Dr. Lamonte Sakai  Know for further recommendations.  Please contact office for sooner follow up if symptoms do not improve or worsen or seek emergency care

## 2015-09-11 NOTE — Telephone Encounter (Signed)
Will send to DOD, TP, as RB was paged and has not responded.   TP please advise. Thanks.

## 2015-09-11 NOTE — Telephone Encounter (Signed)
Spoke with pt. States that she is currently taking Clarithromycin, Ethambutol and Rifampin. She is taking all 3 medications 3 times per week. States that one of these medications is making her sick to her stomach and causing diarrhea. Would like RB's recommendations.  RB - please advise. Thanks.

## 2015-09-12 NOTE — Telephone Encounter (Signed)
ATC home- phone does not ring  Called cell and LMTCB

## 2015-09-13 NOTE — Telephone Encounter (Signed)
Pt is aware of TP's recommendations. Nothing further was needed. 

## 2015-10-26 DIAGNOSIS — H25011 Cortical age-related cataract, right eye: Secondary | ICD-10-CM | POA: Diagnosis not present

## 2015-10-26 DIAGNOSIS — H18411 Arcus senilis, right eye: Secondary | ICD-10-CM | POA: Diagnosis not present

## 2015-10-26 DIAGNOSIS — H2511 Age-related nuclear cataract, right eye: Secondary | ICD-10-CM | POA: Diagnosis not present

## 2015-10-26 DIAGNOSIS — H18412 Arcus senilis, left eye: Secondary | ICD-10-CM | POA: Diagnosis not present

## 2015-10-26 DIAGNOSIS — H2512 Age-related nuclear cataract, left eye: Secondary | ICD-10-CM | POA: Diagnosis not present

## 2015-12-03 ENCOUNTER — Encounter: Payer: Self-pay | Admitting: Emergency Medicine

## 2015-12-03 ENCOUNTER — Telehealth: Payer: Self-pay | Admitting: Emergency Medicine

## 2015-12-03 ENCOUNTER — Ambulatory Visit (INDEPENDENT_AMBULATORY_CARE_PROVIDER_SITE_OTHER): Payer: PPO | Admitting: Emergency Medicine

## 2015-12-03 VITALS — BP 96/62 | HR 70 | Ht 63.5 in | Wt 96.0 lb

## 2015-12-03 DIAGNOSIS — R05 Cough: Secondary | ICD-10-CM | POA: Diagnosis not present

## 2015-12-03 DIAGNOSIS — R053 Chronic cough: Secondary | ICD-10-CM

## 2015-12-03 DIAGNOSIS — A09 Infectious gastroenteritis and colitis, unspecified: Secondary | ICD-10-CM | POA: Diagnosis not present

## 2015-12-03 DIAGNOSIS — R197 Diarrhea, unspecified: Secondary | ICD-10-CM | POA: Insufficient documentation

## 2015-12-03 DIAGNOSIS — J309 Allergic rhinitis, unspecified: Secondary | ICD-10-CM

## 2015-12-03 DIAGNOSIS — J479 Bronchiectasis, uncomplicated: Secondary | ICD-10-CM

## 2015-12-03 DIAGNOSIS — A310 Pulmonary mycobacterial infection: Secondary | ICD-10-CM

## 2015-12-03 NOTE — Assessment & Plan Note (Signed)
Current allergy regimen

## 2015-12-03 NOTE — Assessment & Plan Note (Signed)
3 drug therapy held for now. Will decide the risks and benefits of being back on treatment as we go forward. We will also plan the timing of any repeat imaging as we go forward

## 2015-12-03 NOTE — Assessment & Plan Note (Signed)
Albuterol as needed 

## 2015-12-03 NOTE — Assessment & Plan Note (Signed)
Severe diarrhea associated with weight loss, some incontinence. I am concerned that this may relate to her antibiotic therapy for her mycobacterial disease. She denies any fever, abdominal pain. The diarrhea is watery and has persisted for the last 2 weeks. Her oral intake is very poor and she has lost weight. I will check a C. Difficile toxin, check O and P, check a CBC. I believe she needs a referral to gastroenterology as soon as possible. She has stopped her antibiotics and we will leave her off of these medications for now.

## 2015-12-03 NOTE — Assessment & Plan Note (Signed)
Improved at this time. 

## 2015-12-03 NOTE — Patient Instructions (Addendum)
Please stay off of your antibiotics for [facility] right now.  We will perform a stool test today for C difficile and for other infections.  We will refer you to see gastroenterology ASAP Follow with Dr Lamonte Sakai or one of the nurse practitioners in 2 weeks.

## 2015-12-03 NOTE — Telephone Encounter (Signed)
Called spoke with patient's daughter Morey Hummingbird who reports that she moved in with patient over the weekend and has noticed that pt has been experiencing: Diarrhea 10x yesterday watery diarrhea, dizziness > daughter thinks d/t medication but unsure what pt is taking Not keeping anything down since yesterday No fever, nausea/vomiting Not eating Daughter very concerned and believes she may need to be admitted, but declined advice to go ahead and take pt to the ER now.   Calls pt emaciated > weight is 110lb now, was 122 at last ov w/ RB 1.17.17 Pt stopped 'a medication' because 'it made her go to the bathroom' but Morey Hummingbird is unsure of the name  Pt is already scheduled for follow up with RB this afternoon at 2:15pm and reportedly will be driving herself.  Advised Morey Hummingbird against this with patient's symptoms and recommended someone drive her.  Morey Hummingbird is unable to accompany patient but reported she will find someone to bring pt to appt.  Will forward to RB as FYI.  Per 1.17.17 ov w/ RB: Patient Instructions       We will restart antibiotics for Mycobacterium avium:   - Clarithromycin 1000 mg daily for 3 days out of the week - ethambutol 800 mg daily for 3 days out of the week - rifampin 600 mg daily for 3 days out of the week You will need to be on these medications (antibiotics) for several months Continue Allegra once a day Restart fluticasone nasal spray, 2 sprays each nostril once a day Restart albuterol inhaler, 2 puffs up to every 4 hours if needed for shortness of breath Follow with Dr Lamonte Sakai in 3 months or sooner if you have any problems.

## 2015-12-03 NOTE — Addendum Note (Signed)
Addended by: Desmond Dike C on: 12/03/2015 02:54 PM   Modules accepted: Orders

## 2015-12-03 NOTE — Progress Notes (Signed)
Subjective:    Patient ID: [name] [name], female    DOB: 1935/05/08, 81 y.o.   MRN: [id number]  HPI 81 yo woman, never smoker, Hx of HTN, hyperlipidemia. Seen in the past her by Dr Melvyn Novas for chronic cough and bronchiectasis on CT scan of the chest.    ROV 12/21/14 -- follow-up visit for cough and bronchiectasis. She had a reassuring CT scan of the chest in October 2015. She has continued to have cough, usually green. She is on mucinex-D. She has run out of fluticasone nasal spray.  She remains on protonix. She is on loratadine. She has no dyspnea, CP, wheeze.   ROV 04/03/15 -- Follow-up visit for bronchiectasis and associated chronic cough. We will also followed some waxing and waning micronodular disease by CT scan of the chest, most recent 05/2014. She reports today that she was experiencing stable cough, more phlegm, and hemoptysis beginning of this month. She received abx and steroids and her cough and bleeding resolved. CXR 03/22/15 was reviewed by me, showed RML and lingular scar. She denies any epistaxis. Now she is coughing again but no more hemoptysis. Not on BD's. Not febrile. No aches, URI sx. Remains on mucinex-D, loratdine. She is not on fluticasone NS.   ROV 05/10/15 -- follow-up visit for chronic cough and bronchiectasis. She has had some waxing and waning micronodular disease on CT scan of the chest which prompted a repeat scan on 04/05/15. I have reviewed this scan personally, shows persistence of her bilateral upper lobe predominant bronchiectasis. One of her left mid lung nodules has a more solid appearance on the scan then on her prior from 06/06/14.we performed bronchoscopy on 04/16/15, culture showed normal flora and the AFB and fungal cultures are still negative to date.  Her cough is better. No more hemoptysis.   ROV 06/01/15 -- follow-up visit for bronchiectasis and chronic cough. She underwent bronchoscopy on 04/16/15 and AFB was positive for Mycobacterium avium. She presents today to  discuss possible treatment.  She has albuterol and has started using it BID as it is helping her. Note mild obstruction on her spirometry. She has stopped mucinex. Her cough is much better. Less cough and less sputum.    ROV 09/04/15 -- follow-up visit for Mycobacterium avium and associated bronchiectasis with chronic cough.  Last time we started clarithromycin, ethambutol, rifampin with the intention of treating for several months. She took these temporarily, for a week only, but did not realize she was to continue. She has been coughing, probably more for the last several days, has had nasal drip and a sore throat. She wonders if she should be back on albuterol. Just changed her loratadine to allegra. Not on NS, unsure whether she wants to restart,   ROV 12/03/15 -- follow up visit for history of bronchiectasis with chronic cough in the setting of Mycobacterium avium infection. She has been on 3 drug therapy but had stopped this 2 weeks ago because of severe diarrhea, loss of appetite. She is having very poor PO intake, has anorexia. No abdominal pain. No fevers. No other symptoms except for weakness.  Her cough is better than past visits.        Objective:   Physical Exam Filed Vitals:   12/03/15 1406  BP: 96/62  Pulse: 70  Height: 5' 3.5" (1.613 m)  Weight: 96 lb (43.545 kg)  SpO2: 97%   Gen: Pleasant, thin woman, in no distress,  normal affect  ENT: No lesions,  mouth clear,  oropharynx  clear, mild nasal congestion  Neck: No JVD, no TMG, no carotid bruits, no stridor  Lungs: No use of accessory muscles, no dullness to percussion, diminished BS in bases   Cardiovascular: RRR, heart sounds normal, no murmur or gallops, no peripheral edema  Musculoskeletal: No deformities, no cyanosis or clubbing  Neuro: alert, non focal  Skin: Warm, no lesions or rashes    Assessment & Plan:   Diarrhea Severe diarrhea associated with weight loss, some incontinence. I am concerned that this may  relate to her antibiotic therapy for her mycobacterial disease. She denies any fever, abdominal pain. The diarrhea is watery and has persisted for the last 2 weeks. Her oral intake is very poor and she has lost weight. I will check a C. Difficile toxin, check O and P, check a CBC. I believe she needs a referral to gastroenterology as soon as possible. She has stopped her antibiotics and we will leave her off of these medications for now.  Chronic cough Improved at this time  Bronchiectasis without acute exacerbation Albuterol as needed  Rhinitis Current allergy regimen  Mycobacterium avium infection ([place]) 3 drug therapy held for now. Will decide the risks and benefits of being back on treatment as we go forward. We will also plan the timing of any repeat imaging as we go forward

## 2015-12-05 DIAGNOSIS — Z86010 Personal history of colonic polyps: Secondary | ICD-10-CM | POA: Diagnosis not present

## 2015-12-05 DIAGNOSIS — R634 Abnormal weight loss: Secondary | ICD-10-CM | POA: Diagnosis not present

## 2015-12-05 DIAGNOSIS — R197 Diarrhea, unspecified: Secondary | ICD-10-CM | POA: Diagnosis not present

## 2015-12-17 ENCOUNTER — Encounter: Payer: Self-pay | Admitting: Acute Care

## 2015-12-17 ENCOUNTER — Ambulatory Visit: Payer: PPO | Admitting: Acute Care

## 2015-12-17 ENCOUNTER — Ambulatory Visit (INDEPENDENT_AMBULATORY_CARE_PROVIDER_SITE_OTHER): Payer: PPO | Admitting: Acute Care

## 2015-12-17 VITALS — BP 120/78 | HR 76 | Ht 63.5 in | Wt 98.4 lb

## 2015-12-17 DIAGNOSIS — R197 Diarrhea, unspecified: Secondary | ICD-10-CM

## 2015-12-17 DIAGNOSIS — A310 Pulmonary mycobacterial infection: Secondary | ICD-10-CM

## 2015-12-17 DIAGNOSIS — A09 Infectious gastroenteritis and colitis, unspecified: Secondary | ICD-10-CM | POA: Diagnosis not present

## 2015-12-17 NOTE — Assessment & Plan Note (Signed)
Unable to tolerate 3 drug therapy. Diarrhea has stopped and she is feeling well. Recent GI consult with Dr. Benson Norway For colonoscopy when she has gained some weight back. Plan: No further ABX treatment for MAC/ needs to gain weight and have colonoscopy. Weigh daily to monitor weight stability Follow up with Dr. Benson Norway

## 2015-12-17 NOTE — Assessment & Plan Note (Signed)
Diarrhea resolved with holding of triple antibiotic therapy Plan: For now remain off the antibiotics, as the diarrhea has stopped and you are beginning to eat with an appetite. Weigh yourself each morning after you void and keep track of your weight. We want you to start gaining back the weight you lost. Follow up with Dr. Benson Norway as scheduled. Continue to try eating small frequent meals Add boost twice daily . Follow up appointment with Dr. Lamonte Sakai at his first available so you can discuss if the risks of treating the MAC out weight the benefits. Please contact office for sooner follow up if symptoms do not improve or worsen or seek emergency care

## 2015-12-17 NOTE — Progress Notes (Signed)
Subjective:    Patient ID: [name], female    DOB: July 24, 1935, 81 y.o.   MRN: [id number]  HPI   81 yo woman, never smoker, Hx of HTN, hyperlipidemia. Seen in the past her by Dr Melvyn Novas for chronic cough and bronchiectasis on CT scan of the chest. She underwent bronchoscopy on 04/16/15 and AFB was positive for Mycobacterium avium. She was stared on 3 drug therapy which had to be stopped due to diarrhea and weight loss.   12/17/2015  Follow Up OV:  Returns to the office for follow up. She has stopped the 3 drug therapy she was placed on for MAC, and symptoms of diarrhea, lack of appetite have resolved. No diarrhea, her weight today is documented as 98 lbs 6.4 oz. She states her appetite is improving, but she has not gained her weight back.. She has not started using Boost. She did see Dr. Benson Norway, who is going to a colonoscopy once she has gained weight.He wanted her to start Boost as meal supplement for additional calories.Bronchitis has cleared, no fever, chest pain, orthopnea,hemoptysis leg or calf pain.Pt. States she is feeling good.She did not have her stool checked for C-diff when she was symptomatic as was ordered. She is no longer having diarrhea. She is having a colonoscopy per Dr. Benson Norway after she gains some weight. If symptoms resume she will need C-diff checked at that time.   Current outpatient prescriptions:  .  acetaminophen (TYLENOL) 500 MG tablet, Take 500 mg by mouth every 6 (six) hours as needed for mild pain. , Disp: , Rfl:  .  albuterol (PROVENTIL HFA;VENTOLIN HFA) 108 (90 Base) MCG/ACT inhaler, Inhale 2 puffs into the lungs every 6 (six) hours as needed for wheezing or shortness of breath., Disp: 1 Inhaler, Rfl: 2 .  atenolol (TENORMIN) 25 MG tablet, Take 25 mg by mouth daily., Disp: , Rfl:  .  bisoprolol-hydrochlorothiazide (ZIAC) 5-6.25 MG per tablet, Take 1 tablet by mouth daily. Once daily, Disp: , Rfl:  .  calcium citrate-vitamin D 200-200 MG-UNIT TABS, Take 1 tablet by  mouth daily. Reported on 12/17/2015, Disp: , Rfl:  .  ethambutol (MYAMBUTOL) 400 MG tablet, Take 2 tablets (800 mg total) by mouth 3 (three) times a week., Disp: 24 tablet, Rfl: 5 .  fluticasone (FLONASE) 50 MCG/ACT nasal spray, Place 2 sprays into both nostrils daily., Disp: 16 g, Rfl: 5 .  loratadine (CLARITIN) 10 MG tablet, Take 10 mg by mouth daily., Disp: , Rfl:  .  pantoprazole (PROTONIX) 40 MG tablet, Take 40 mg by mouth daily., Disp: , Rfl:  .  pseudoephedrine-guaifenesin (MUCINEX D) 60-600 MG per tablet, Take 1 tablet by mouth daily.  , Disp: , Rfl:  .  rifampin (RIFADIN) 300 MG capsule, Take 2 capsules (600 mg total) by mouth 3 (three) times a week., Disp: 24 capsule, Rfl: 5 .  simvastatin (ZOCOR) 40 MG tablet, Take 40 mg by mouth daily. Once daily, Disp: , Rfl:    Past Medical History  Diagnosis Date  . Hypertension   . Hypercholesteremia   . Heart murmur     No Known Allergies   Review of Systems Constitutional:   +  weight loss, no night sweats,  Fevers, chills, fatigue, or  lassitude.  HEENT:   No headaches,  Difficulty swallowing,  Tooth/dental problems, or  Sore throat,                No sneezing, itching, ear ache, nasal congestion, post nasal drip,  CV:  No chest pain,  Orthopnea, PND, swelling in lower extremities, anasarca, dizziness, palpitations, syncope.   GI  No heartburn, indigestion, abdominal pain, nausea, vomiting, diarrhea, change in bowel habits, loss of appetite, bloody stools.   Resp: No shortness of breath with exertion or at rest.  No excess mucus, no productive cough,  No non-productive cough,  No coughing up of blood.  No change in color of mucus.  No wheezing.  No chest wall deformity  Skin: no rash or lesions.  GU: no dysuria, change in color of urine, no urgency or frequency.  No flank pain, no hematuria   MS:  No joint pain or swelling.  No decreased range of motion.  No back pain.  Psych:  No change in mood or affect. No depression or  anxiety.  No memory loss.          Objective:   Physical Exam   BP 120/78 mmHg  Pulse 76  Ht 5' 3.5" (1.613 m)  Wt 98 lb 6.4 oz (44.634 kg)  BMI 17.16 kg/m2  SpO2 97% Physical Exam:  General- No distress,  A&Ox3, very thin elderly female. ENT: No sinus tenderness, TM clear, pale nasal mucosa, no oral exudate,no post nasal drip, no LAN Cardiac: S1, S2, regular rate and rhythm, no murmur Chest: No wheeze/ rales/ dullness; no accessory muscle use, no nasal flaring, no sternal retractions Abd.: Soft Non-tender, soft and flat Ext: No clubbing cyanosis, edema Neuro:  normal strength Skin: No rashes, warm and dry Psych: normal mood and behavior   Magdalen Spatz, AGACNP-BC [facility] 12/17/2015     Assessment & Plan:

## 2015-12-17 NOTE — Patient Instructions (Addendum)
It is nice to meet you today. For now remain off the antibiotics, as the diarrhea has stopped and you are beginning to eat with an appetite. Weigh yourself each morning after you void and keep track of your weight. We want you to start gaining back the weight you lost. Follow up with Dr. Benson Norway as scheduled. Continue to try eating small frequent meals Add boost twice daily . Follow up appointment with Dr. Lamonte Sakai at his first available so you can discuss if the risks of treating the MAC out weight the benefits. Please contact office for sooner follow up if symptoms do not improve or worsen or seek emergency care

## 2015-12-19 DIAGNOSIS — Z86010 Personal history of colonic polyps: Secondary | ICD-10-CM | POA: Diagnosis not present

## 2015-12-19 DIAGNOSIS — R634 Abnormal weight loss: Secondary | ICD-10-CM | POA: Diagnosis not present

## 2016-01-02 ENCOUNTER — Ambulatory Visit (INDEPENDENT_AMBULATORY_CARE_PROVIDER_SITE_OTHER): Payer: PPO | Admitting: Emergency Medicine

## 2016-01-02 ENCOUNTER — Encounter: Payer: Self-pay | Admitting: Emergency Medicine

## 2016-01-02 VITALS — BP 120/66 | HR 76 | Ht 63.5 in | Wt 101.0 lb

## 2016-01-02 DIAGNOSIS — J479 Bronchiectasis, uncomplicated: Secondary | ICD-10-CM | POA: Diagnosis not present

## 2016-01-02 DIAGNOSIS — A310 Pulmonary mycobacterial infection: Secondary | ICD-10-CM | POA: Diagnosis not present

## 2016-01-02 NOTE — Patient Instructions (Addendum)
We will not restart antibiotics at this time.  Follow with Dr Benson Norway as planned We will discuss in the future the possibility of restarting a different antibiotic schedule. If so then we will probably refer you to see the Infectious Diseases office.  Follow with Dr Lamonte Sakai in 6 months or sooner if you have any problems

## 2016-01-02 NOTE — Assessment & Plan Note (Signed)
Unable tolerate every day 3 drug regimen. At this time given the fact that her cough is improved and she is starting to improve her by mouth intake and believe that we can defer restarting antibiotics. At some point we will likely need to readdress. If so then we will refer her to infectious diseases to see if there is a regimen that will be better tolerated.

## 2016-01-02 NOTE — Progress Notes (Signed)
Subjective:    Patient ID: [name] [name], female    DOB: [birth year], 81 y.o.   MRN: [id number]  HPI 81 yo woman, never smoker, Hx of HTN, hyperlipidemia. Seen in the past her by Dr Melvyn Novas for chronic cough and bronchiectasis on CT scan of the chest.    ROV 12/21/14 -- follow-up visit for cough and bronchiectasis. She had a reassuring CT scan of the chest in October 2015. She has continued to have cough, usually green. She is on mucinex-D. She has run out of fluticasone nasal spray.  She remains on protonix. She is on loratadine. She has no dyspnea, CP, wheeze.   ROV 04/03/15 -- Follow-up visit for bronchiectasis and associated chronic cough. We will also followed some waxing and waning micronodular disease by CT scan of the chest, most recent 05/2014. She reports today that she was experiencing stable cough, more phlegm, and hemoptysis beginning of this month. She received abx and steroids and her cough and bleeding resolved. CXR 03/22/15 was reviewed by me, showed RML and lingular scar. She denies any epistaxis. Now she is coughing again but no more hemoptysis. Not on BD's. Not febrile. No aches, URI sx. Remains on mucinex-D, loratdine. She is not on fluticasone NS.   ROV 05/10/15 -- follow-up visit for chronic cough and bronchiectasis. She has had some waxing and waning micronodular disease on CT scan of the chest which prompted a repeat scan on 04/05/15. I have reviewed this scan personally, shows persistence of her bilateral upper lobe predominant bronchiectasis. One of her left mid lung nodules has a more solid appearance on the scan then on her prior from 06/06/14.we performed bronchoscopy on 04/16/15, culture showed normal flora and the AFB and fungal cultures are still negative to date.  Her cough is better. No more hemoptysis.   ROV 06/01/15 -- follow-up visit for bronchiectasis and chronic cough. She underwent bronchoscopy on 04/16/15 and AFB was positive for Mycobacterium avium. She presents today to  discuss possible treatment.  She has albuterol and has started using it BID as it is helping her. Note mild obstruction on her spirometry. She has stopped mucinex. Her cough is much better. Less cough and less sputum.    ROV 09/04/15 -- follow-up visit for Mycobacterium avium and associated bronchiectasis with chronic cough.  Last time we started clarithromycin, ethambutol, rifampin with the intention of treating for several months. She took these temporarily, for a week only, but did not realize she was to continue. She has been coughing, probably more for the last several days, has had nasal drip and a sore throat. She wonders if she should be back on albuterol. Just changed her loratadine to allegra. Not on NS, unsure whether she wants to restart,   ROV 12/03/15 -- follow up visit for history of bronchiectasis with chronic cough in the setting of Mycobacterium avium infection. She has been on 3 drug therapy but had stopped this 2 weeks ago because of severe diarrhea, loss of appetite. She is having very poor PO intake, has anorexia. No abdominal pain. No fevers. No other symptoms except for weakness.  Her cough is better than past visits.   ROV 01/02/16 -- follow-up visit for history of Mycobacterium avium and associated bronchiectasis. We stopped her antibiotic regimen due to malaise, anorexia, weakness, weight loss. She is planning for follow-up with Dr Benson Norway in GI. She believes that she is getting her strength back. She has not really gained back any wt. She is doing well w regard  to cough - much less.        Objective:   Physical Exam Filed Vitals:   01/02/16 1332  BP: 120/66  Pulse: 76  Height: 5' 3.5" (1.613 m)  Weight: 101 lb (45.813 kg)  SpO2: 96%   Gen: Pleasant, thin woman, in no distress,  normal affect  ENT: No lesions,  mouth clear,  oropharynx clear, mild nasal congestion  Neck: No JVD, no TMG, no carotid bruits, no stridor  Lungs: No use of accessory muscles, no dullness to  percussion, diminished BS in bases   Cardiovascular: RRR, heart sounds normal, no murmur or gallops, no peripheral edema  Musculoskeletal: No deformities, no cyanosis or clubbing  Neuro: alert, non focal  Skin: Warm, no lesions or rashes    Assessment & Plan:   Mycobacterium avium infection ([facility]) Unable tolerate every day 3 drug regimen. At this time given the fact that her cough is improved and she is starting to improve her by mouth intake and believe that we can defer restarting antibiotics. At some point we will likely need to readdress. If so then we will refer her to infectious diseases to see if there is a regimen that will be better tolerated.  Bronchiectasis without acute exacerbation Continue current pulmonary hygiene. Will not restart anabolic regimen at this time.      Baltazar Apo, MD, PhD 01/02/2016, 1:46 PM [facility] [phone] or if no answer [phone]

## 2016-01-02 NOTE — Assessment & Plan Note (Signed)
Continue current pulmonary hygiene. Will not restart anabolic regimen at this time.

## 2016-01-24 DIAGNOSIS — I10 Essential (primary) hypertension: Secondary | ICD-10-CM | POA: Diagnosis not present

## 2016-01-24 DIAGNOSIS — K219 Gastro-esophageal reflux disease without esophagitis: Secondary | ICD-10-CM | POA: Diagnosis not present

## 2016-01-24 DIAGNOSIS — E7800 Pure hypercholesterolemia, unspecified: Secondary | ICD-10-CM | POA: Diagnosis not present

## 2016-01-24 DIAGNOSIS — F3341 Major depressive disorder, recurrent, in partial remission: Secondary | ICD-10-CM | POA: Diagnosis not present

## 2016-01-24 DIAGNOSIS — R7301 Impaired fasting glucose: Secondary | ICD-10-CM | POA: Diagnosis not present

## 2016-01-24 DIAGNOSIS — F5102 Adjustment insomnia: Secondary | ICD-10-CM | POA: Diagnosis not present

## 2016-01-24 DIAGNOSIS — F419 Anxiety disorder, unspecified: Secondary | ICD-10-CM | POA: Diagnosis not present

## 2016-01-28 DIAGNOSIS — H2511 Age-related nuclear cataract, right eye: Secondary | ICD-10-CM | POA: Diagnosis not present

## 2016-01-28 DIAGNOSIS — Z961 Presence of intraocular lens: Secondary | ICD-10-CM | POA: Diagnosis not present

## 2016-01-29 DIAGNOSIS — H2512 Age-related nuclear cataract, left eye: Secondary | ICD-10-CM | POA: Diagnosis not present

## 2016-02-11 DIAGNOSIS — H52223 Regular astigmatism, bilateral: Secondary | ICD-10-CM | POA: Diagnosis not present

## 2016-02-11 DIAGNOSIS — H2512 Age-related nuclear cataract, left eye: Secondary | ICD-10-CM | POA: Diagnosis not present

## 2016-02-11 DIAGNOSIS — Z9842 Cataract extraction status, left eye: Secondary | ICD-10-CM | POA: Diagnosis not present

## 2016-02-11 DIAGNOSIS — Z961 Presence of intraocular lens: Secondary | ICD-10-CM | POA: Diagnosis not present

## 2016-02-21 DIAGNOSIS — R634 Abnormal weight loss: Secondary | ICD-10-CM | POA: Diagnosis not present

## 2016-02-21 DIAGNOSIS — F419 Anxiety disorder, unspecified: Secondary | ICD-10-CM | POA: Diagnosis not present

## 2016-03-19 DIAGNOSIS — Z86010 Personal history of colonic polyps: Secondary | ICD-10-CM | POA: Diagnosis not present

## 2016-03-19 DIAGNOSIS — R634 Abnormal weight loss: Secondary | ICD-10-CM | POA: Diagnosis not present

## 2016-04-28 DIAGNOSIS — R634 Abnormal weight loss: Secondary | ICD-10-CM | POA: Diagnosis not present

## 2016-04-28 DIAGNOSIS — F419 Anxiety disorder, unspecified: Secondary | ICD-10-CM | POA: Diagnosis not present

## 2016-06-18 DIAGNOSIS — Z86010 Personal history of colonic polyps: Secondary | ICD-10-CM | POA: Diagnosis not present

## 2016-06-18 DIAGNOSIS — R634 Abnormal weight loss: Secondary | ICD-10-CM | POA: Diagnosis not present

## 2016-07-07 ENCOUNTER — Ambulatory Visit (INDEPENDENT_AMBULATORY_CARE_PROVIDER_SITE_OTHER): Payer: PPO | Admitting: Emergency Medicine

## 2016-07-07 ENCOUNTER — Encounter: Payer: Self-pay | Admitting: Emergency Medicine

## 2016-07-07 DIAGNOSIS — J479 Bronchiectasis, uncomplicated: Secondary | ICD-10-CM | POA: Diagnosis not present

## 2016-07-07 DIAGNOSIS — R05 Cough: Secondary | ICD-10-CM | POA: Diagnosis not present

## 2016-07-07 DIAGNOSIS — R053 Chronic cough: Secondary | ICD-10-CM

## 2016-07-07 MED ORDER — ALBUTEROL SULFATE HFA 108 (90 BASE) MCG/ACT IN AERS: 2.0000 | INHALATION_SPRAY | Freq: Four times a day (QID) | RESPIRATORY_TRACT | 2 refills | Status: AC | PRN

## 2016-07-07 NOTE — Assessment & Plan Note (Signed)
Please continue your loratadine daily We will refill your albuterol to use 2 puffs up to every 4 hours if needed for shortness of breath.  We will repeat your CT scan chest in August 2018.  Follow with Dr Lamonte Sakai in August 2018 after your Ct scan to review the results.

## 2016-07-07 NOTE — Patient Instructions (Addendum)
Please continue your loratadine daily Consider restarting your flonase nasal spray, 2 sprays each nostril daily Continue your protonix daily We will refill your albuterol to use 2 puffs up to every 4 hours if needed for shortness of breath.  We will repeat your CT scan chest in August 2018.  Follow with Dr Lamonte Sakai in August 2018 after your Ct scan to review the results.

## 2016-07-07 NOTE — Assessment & Plan Note (Signed)
Consider restarting your flonase nasal spray, 2 sprays each nostril daily Continue your protonix daily

## 2016-07-07 NOTE — Progress Notes (Signed)
Subjective:    Patient ID: [name] [name], female    DOB: [birth year], 81 y.o.   MRN: [id number]  HPI 81 yo woman, never smoker, Hx of HTN, hyperlipidemia. Seen in the past her by Dr Melvyn Novas for chronic cough and bronchiectasis on CT scan of the chest.    ROV 12/21/14 -- follow-up visit for cough and bronchiectasis. She had a reassuring CT scan of the chest in October 2015. She has continued to have cough, usually green. She is on mucinex-D. She has run out of fluticasone nasal spray.  She remains on protonix. She is on loratadine. She has no dyspnea, CP, wheeze.   ROV 04/03/15 -- Follow-up visit for bronchiectasis and associated chronic cough. We will also followed some waxing and waning micronodular disease by CT scan of the chest, most recent 05/2014. She reports today that she was experiencing stable cough, more phlegm, and hemoptysis beginning of this month. She received abx and steroids and her cough and bleeding resolved. CXR 03/22/15 was reviewed by me, showed RML and lingular scar. She denies any epistaxis. Now she is coughing again but no more hemoptysis. Not on BD's. Not febrile. No aches, URI sx. Remains on mucinex-D, loratdine. She is not on fluticasone NS.   ROV 05/10/15 -- follow-up visit for chronic cough and bronchiectasis. She has had some waxing and waning micronodular disease on CT scan of the chest which prompted a repeat scan on 04/05/15. I have reviewed this scan personally, shows persistence of her bilateral upper lobe predominant bronchiectasis. One of her left mid lung nodules has a more solid appearance on the scan then on her prior from 06/06/14.we performed bronchoscopy on 04/16/15, culture showed normal flora and the AFB and fungal cultures are still negative to date.  Her cough is better. No more hemoptysis.   ROV 06/01/15 -- follow-up visit for bronchiectasis and chronic cough. She underwent bronchoscopy on 04/16/15 and AFB was positive for Mycobacterium avium. She presents today to  discuss possible treatment.  She has albuterol and has started using it BID as it is helping her. Note mild obstruction on her spirometry. She has stopped mucinex. Her cough is much better. Less cough and less sputum.    ROV 09/04/15 -- follow-up visit for Mycobacterium avium and associated bronchiectasis with chronic cough.  Last time we started clarithromycin, ethambutol, rifampin with the intention of treating for several months. She took these temporarily, for a week only, but did not realize she was to continue. She has been coughing, probably more for the last several days, has had nasal drip and a sore throat. She wonders if she should be back on albuterol. Just changed her loratadine to allegra. Not on NS, unsure whether she wants to restart,   ROV 12/03/15 -- follow up visit for history of bronchiectasis with chronic cough in the setting of Mycobacterium avium infection. She has been on 3 drug therapy but had stopped this 2 weeks ago because of severe diarrhea, loss of appetite. She is having very poor PO intake, has anorexia. No abdominal pain. No fevers. No other symptoms except for weakness.  Her cough is better than past visits.   ROV 01/02/16 -- follow-up visit for history of Mycobacterium avium and associated bronchiectasis. We stopped her antibiotic regimen due to malaise, anorexia, weakness, weight loss. She is planning for follow-up with Dr Benson Norway in GI. She believes that she is getting her strength back. She has not really gained back any wt. She is doing well w regard  to cough - much less.   ROV 07/07/16 -- this is a follow up visit for pt with a hx bronchiectasis> She is colonized with M avium. We attempted to treat w abx but these were stopped due to side effects. I deferred restarting as her cough had improved. She tells me today that her breathing and cough are doing very well.  No sputum at all. Some clear nasal drainage. Her last CT chest was 03/2015. Able to exert.       Objective:    Physical Exam Vitals:   07/07/16 1341  BP: 108/70  BP Location: Left Arm  Cuff Size: Normal  Pulse: (!) 54  SpO2: 97%  Weight: 104 lb (47.2 kg)  Height: 5' 3.5" (1.613 m)   Gen: Pleasant, thin woman, in no distress,  normal affect  ENT: No lesions,  mouth clear,  oropharynx clear, mild nasal congestion  Neck: No JVD, no TMG, no carotid bruits, no stridor  Lungs: No use of accessory muscles, no dullness to percussion, diminished BS in bases   Cardiovascular: RRR, heart sounds normal, no murmur or gallops, no peripheral edema  Musculoskeletal: No deformities, no cyanosis or clubbing  Neuro: alert, non focal  Skin: Warm, no lesions or rashes    04/04/16 --  COMPARISON:  CT 06/06/2014  FINDINGS: Mediastinum/Nodes: No axillary or supraclavicular lymphadenopathy. No mediastinal hilar adenopathy. Coronary calcifications are present. No pericardial fluid. Esophagus is normal.  Lungs/Pleura: Again demonstrated bronchial wall thickening and cylindrical and varicose bronchiectasis primarily in the upper lobes. There is atelectasis within the lingula and RIGHT middle lobe similar to prior. Mild micro nodularity associated with this bronchiectasis pattern.  Within the LEFT lower lobe there is increased density of a previously seen ground-glass opacity. This now solid nodule measures 9 mm on image 33, series 3.  RIGHT upper lobe nodule along the fissure measures 6 mm is unchanged.  Upper abdomen: Limited view of the liver, kidneys, pancreas are unremarkable. Normal adrenal glands.  Musculoskeletal: No aggressive osseous lesion.  IMPRESSION: 1. New solid nodule in the LEFT lower lobe at site of prior ground-glass opacity with differential including a focus of infection versus neoplasm. Patient has a history of waxing and waning nodules therefore recommend short-term CT follow-up (1 month) and if lesion persists recommend FDG PET scan or biopsy. 2. Stable  bronchiectasis and micro nodularity in the upper lobes most suggestive of chronic infection ( MIA or APBA)).     Assessment & Plan:   Bronchiectasis without acute exacerbation Please continue your loratadine daily We will refill your albuterol to use 2 puffs up to every 4 hours if needed for shortness of breath.  We will repeat your CT scan chest in August 2018.  Follow with Dr Lamonte Sakai in August 2018 after your Ct scan to review the results.   Chronic cough Consider restarting your flonase nasal spray, 2 sprays each nostril daily Continue your protonix daily     Baltazar Apo, MD, PhD 07/07/2016, 2:08 PM [facility] [phone] or if no answer [phone]

## 2016-07-23 DIAGNOSIS — F419 Anxiety disorder, unspecified: Secondary | ICD-10-CM | POA: Diagnosis not present

## 2016-07-23 DIAGNOSIS — E7800 Pure hypercholesterolemia, unspecified: Secondary | ICD-10-CM | POA: Diagnosis not present

## 2016-07-23 DIAGNOSIS — R634 Abnormal weight loss: Secondary | ICD-10-CM | POA: Diagnosis not present

## 2016-07-23 DIAGNOSIS — F3341 Major depressive disorder, recurrent, in partial remission: Secondary | ICD-10-CM | POA: Diagnosis not present

## 2016-07-23 DIAGNOSIS — I10 Essential (primary) hypertension: Secondary | ICD-10-CM | POA: Diagnosis not present

## 2016-07-23 DIAGNOSIS — F5102 Adjustment insomnia: Secondary | ICD-10-CM | POA: Diagnosis not present

## 2016-07-29 DIAGNOSIS — I10 Essential (primary) hypertension: Secondary | ICD-10-CM | POA: Diagnosis not present

## 2016-07-29 DIAGNOSIS — E7800 Pure hypercholesterolemia, unspecified: Secondary | ICD-10-CM | POA: Diagnosis not present

## 2016-07-29 DIAGNOSIS — R7301 Impaired fasting glucose: Secondary | ICD-10-CM | POA: Diagnosis not present

## 2016-08-30 IMAGING — CT CT CHEST W/O CM
2 of 3 series · 15 of 36 positions shown, 18 images · IV contrast (Omnipaque 300)
Comparison: CT 06/06/2014

CLINICAL DATA: Hemoptysis 2 weeks prior. Persistent cough.
Subsequent encounter.

EXAM:
CT CHEST WITHOUT CONTRAST
TECHNIQUE: Multidetector CT imaging of the chest was performed following the
standard protocol without IV contrast.

[Series 2: chest routine with · axial · 0.60mm/px · z∈[-292,-32]mm · 12 of 62 slices shown, 15 images]
[im 5/62  mediastinal]
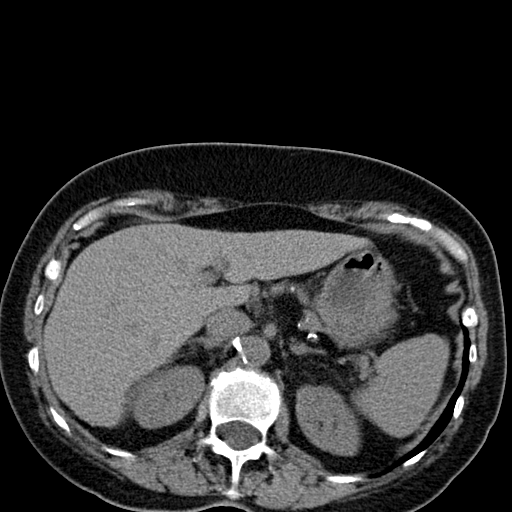
[im 5/62  lung]
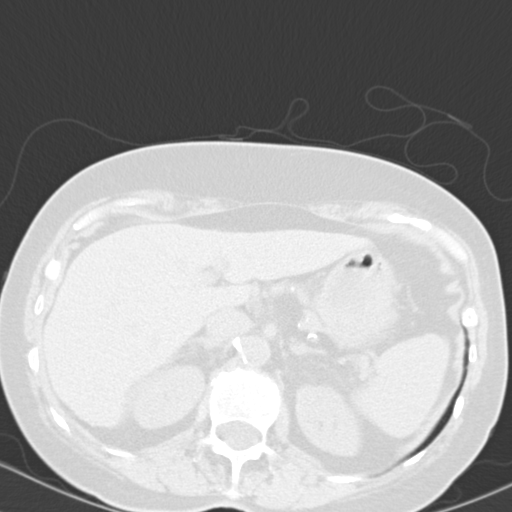
[im 10/62  lung]
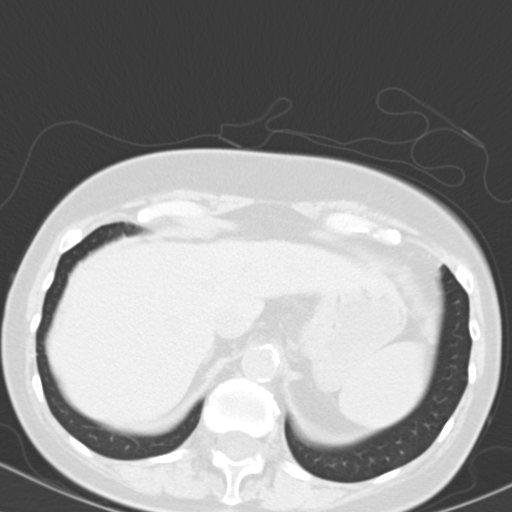
[im 14/62  lung]
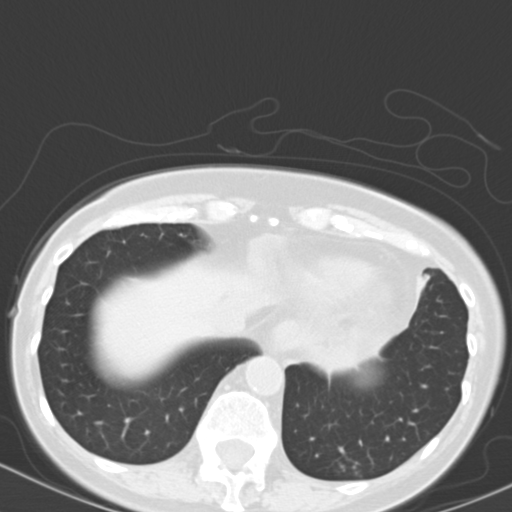
[im 19/62  lung]
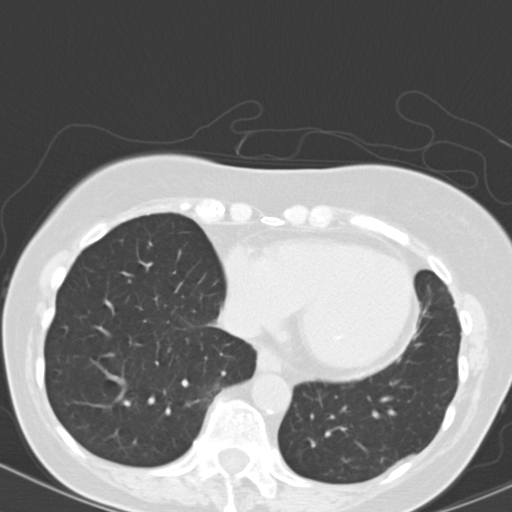
[im 23/62  mediastinal]
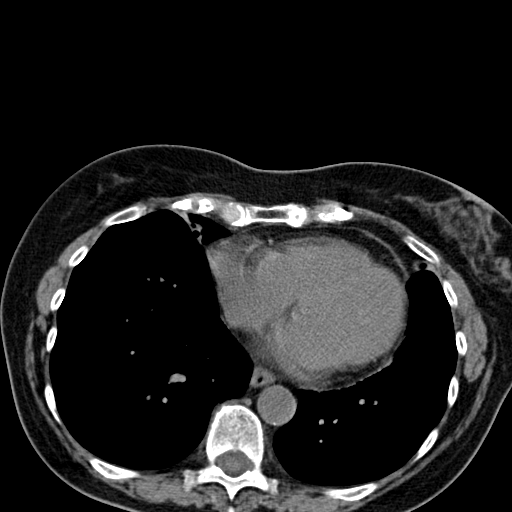
[im 23/62  lung]
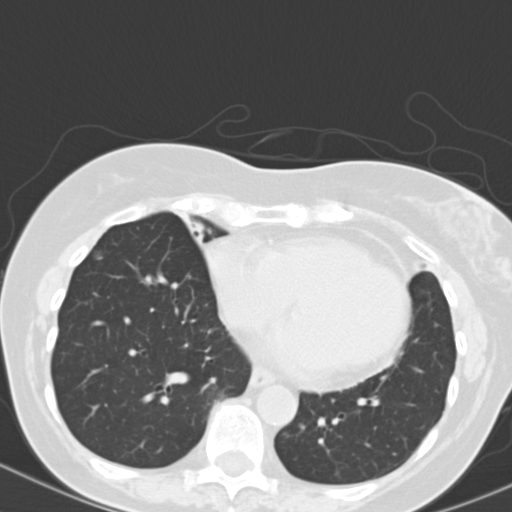
[im 28/62  lung]
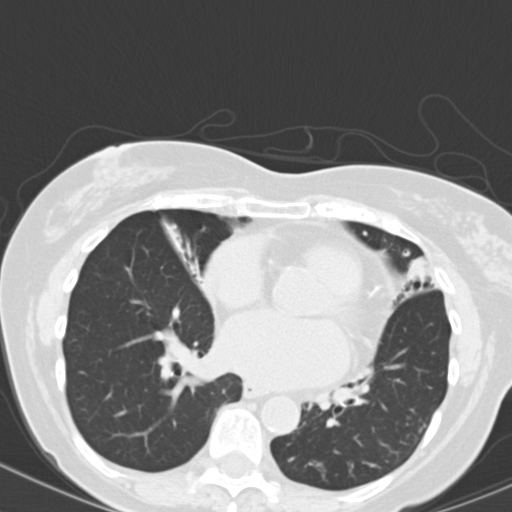
[im 34/62  lung]
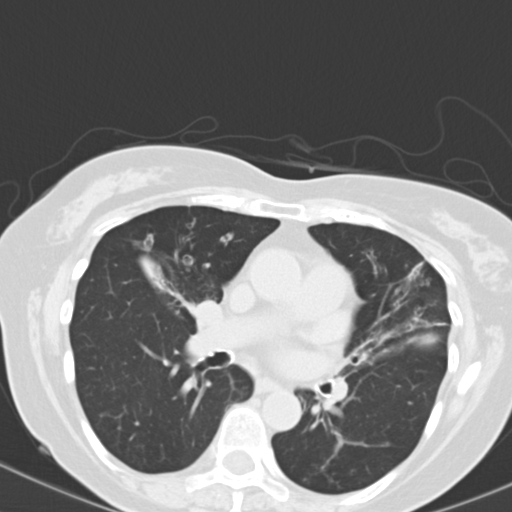
[im 39/62  lung]
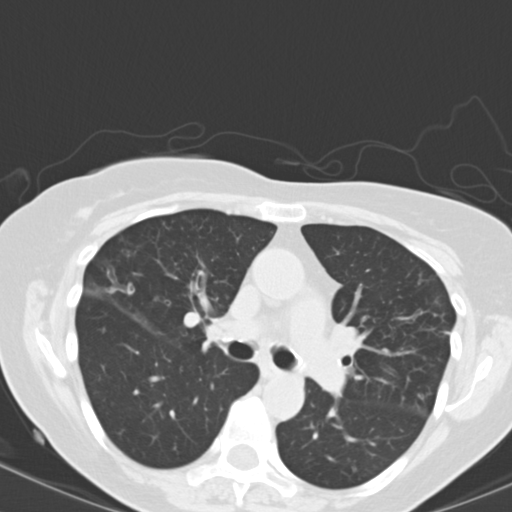
[im 43/62  mediastinal]
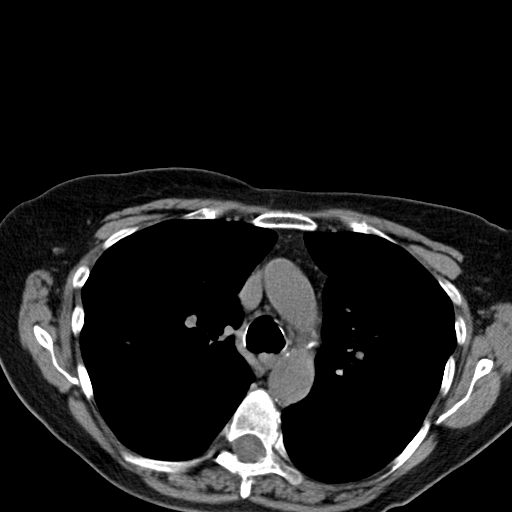
[im 43/62  lung]
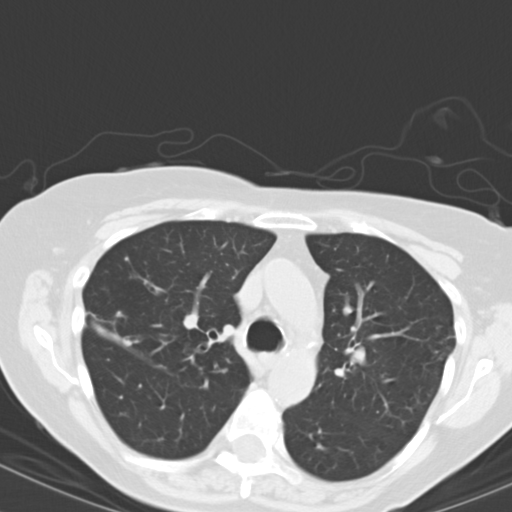
[im 48/62  lung]
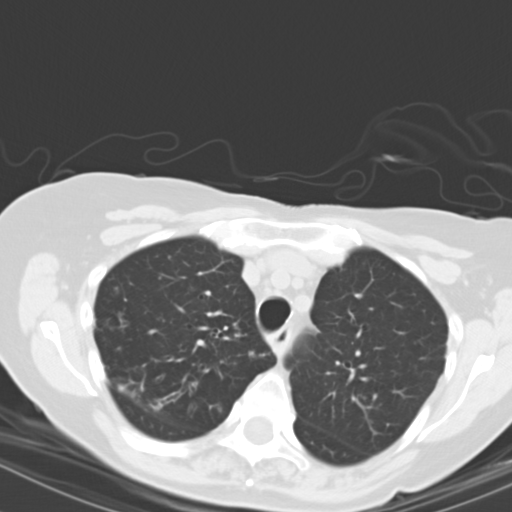
[im 52/62  lung]
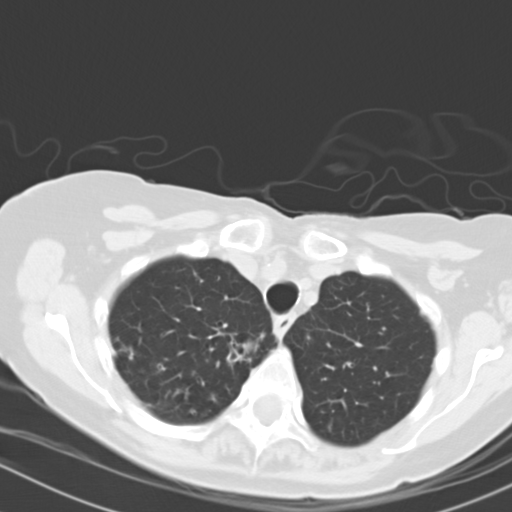
[im 57/62  lung]
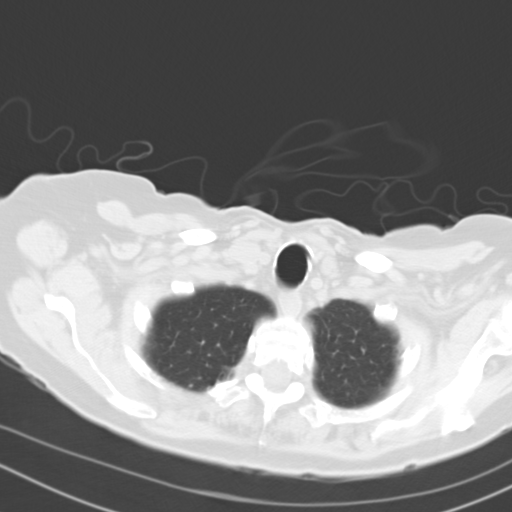

[Series 602: cor · coronal · 0.61mm/px · 3 of 96 slices shown]
[im 20/96  lung]
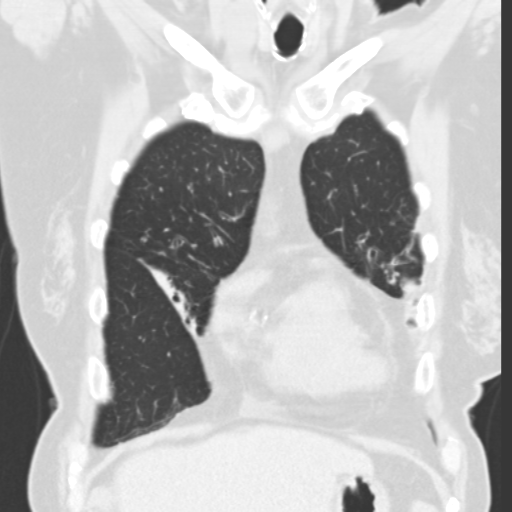
[im 39/96  lung]
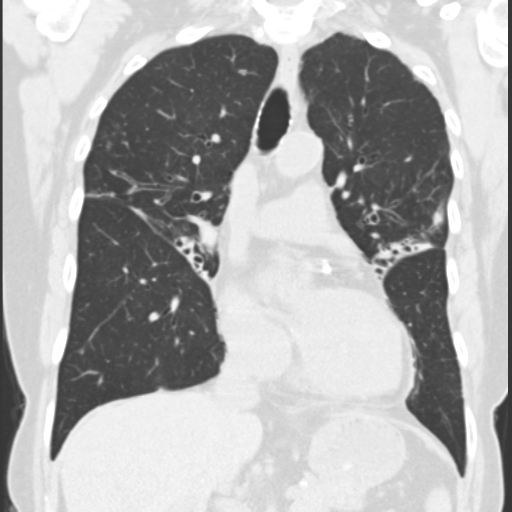
[im 58/96  lung]
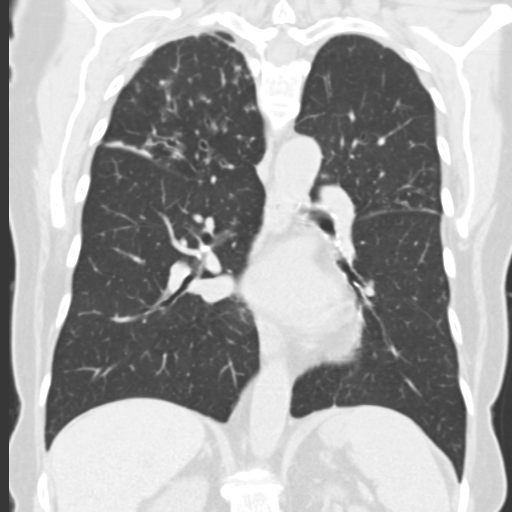

[15 of 36 positions shown; findings below may reference images not displayed]

FINDINGS: Mediastinum/Nodes: No axillary or supraclavicular lymphadenopathy.
No mediastinal hilar adenopathy. Coronary calcifications are
present. No pericardial fluid. Esophagus is normal.

Lungs/Pleura: Again demonstrated bronchial wall thickening and
cylindrical and varicose bronchiectasis primarily in the upper
lobes. There is atelectasis within the lingula and RIGHT middle lobe
similar to prior. Mild micro nodularity associated with this
bronchiectasis pattern.

Within the LEFT lower lobe there is increased density of a
previously seen ground-glass opacity. This now solid nodule measures
9 mm on image 33, series 3.

RIGHT upper lobe nodule along the fissure measures 6 mm is
unchanged.

Upper abdomen: Limited view of the liver, kidneys, pancreas are
unremarkable. Normal adrenal glands.

Musculoskeletal: No aggressive osseous lesion.
IMPRESSION: 1. New solid nodule in the LEFT lower lobe at site of prior
ground-glass opacity with differential including a focus of
infection versus neoplasm. Patient has a history of waxing and
waning nodules therefore recommend short-term CT follow-up (1 month)
and if lesion persists recommend FDG PET scan or biopsy.
2. Stable bronchiectasis and micro nodularity in the upper lobes
most suggestive of chronic infection ( KLPIGBB or APBA)).

## 2016-09-19 DIAGNOSIS — J22 Unspecified acute lower respiratory infection: Secondary | ICD-10-CM | POA: Diagnosis not present

## 2016-09-22 DIAGNOSIS — J22 Unspecified acute lower respiratory infection: Secondary | ICD-10-CM | POA: Diagnosis not present

## 2017-01-21 DIAGNOSIS — F419 Anxiety disorder, unspecified: Secondary | ICD-10-CM | POA: Diagnosis not present

## 2017-01-21 DIAGNOSIS — F3341 Major depressive disorder, recurrent, in partial remission: Secondary | ICD-10-CM | POA: Diagnosis not present

## 2017-01-21 DIAGNOSIS — R413 Other amnesia: Secondary | ICD-10-CM | POA: Diagnosis not present

## 2017-01-21 DIAGNOSIS — E785 Hyperlipidemia, unspecified: Secondary | ICD-10-CM | POA: Diagnosis not present

## 2017-01-21 DIAGNOSIS — I10 Essential (primary) hypertension: Secondary | ICD-10-CM | POA: Diagnosis not present

## 2017-01-21 DIAGNOSIS — F5102 Adjustment insomnia: Secondary | ICD-10-CM | POA: Diagnosis not present

## 2017-02-07 ENCOUNTER — Encounter (HOSPITAL_COMMUNITY): Payer: Self-pay

## 2017-02-07 ENCOUNTER — Inpatient Hospital Stay (HOSPITAL_COMMUNITY)
Admission: EM | Admit: 2017-02-07 | Discharge: 2017-02-12 | DRG: 643 | Disposition: A | Discharge status: Home Health | Payer: PPO | Attending: Internal Medicine | Admitting: Internal Medicine

## 2017-02-07 DIAGNOSIS — Z66 Do not resuscitate: Secondary | ICD-10-CM | POA: Diagnosis present

## 2017-02-07 DIAGNOSIS — F0390 Unspecified dementia without behavioral disturbance: Secondary | ICD-10-CM | POA: Diagnosis not present

## 2017-02-07 DIAGNOSIS — Z79899 Other long term (current) drug therapy: Secondary | ICD-10-CM | POA: Diagnosis not present

## 2017-02-07 DIAGNOSIS — F0150 Vascular dementia without behavioral disturbance: Secondary | ICD-10-CM

## 2017-02-07 DIAGNOSIS — E43 Unspecified severe protein-calorie malnutrition: Secondary | ICD-10-CM | POA: Diagnosis present

## 2017-02-07 DIAGNOSIS — E785 Hyperlipidemia, unspecified: Secondary | ICD-10-CM | POA: Diagnosis present

## 2017-02-07 DIAGNOSIS — E878 Other disorders of electrolyte and fluid balance, not elsewhere classified: Secondary | ICD-10-CM | POA: Diagnosis not present

## 2017-02-07 DIAGNOSIS — F320 Major depressive disorder, single episode, mild: Secondary | ICD-10-CM | POA: Diagnosis not present

## 2017-02-07 DIAGNOSIS — D72829 Elevated white blood cell count, unspecified: Secondary | ICD-10-CM | POA: Diagnosis not present

## 2017-02-07 DIAGNOSIS — E861 Hypovolemia: Secondary | ICD-10-CM | POA: Diagnosis present

## 2017-02-07 DIAGNOSIS — R109 Unspecified abdominal pain: Secondary | ICD-10-CM | POA: Diagnosis not present

## 2017-02-07 DIAGNOSIS — F0391 Unspecified dementia with behavioral disturbance: Secondary | ICD-10-CM | POA: Diagnosis not present

## 2017-02-07 DIAGNOSIS — R17 Unspecified jaundice: Secondary | ICD-10-CM | POA: Diagnosis present

## 2017-02-07 DIAGNOSIS — R740 Nonspecific elevation of levels of transaminase and lactic acid dehydrogenase [LDH]: Secondary | ICD-10-CM | POA: Diagnosis present

## 2017-02-07 DIAGNOSIS — R634 Abnormal weight loss: Secondary | ICD-10-CM | POA: Diagnosis not present

## 2017-02-07 DIAGNOSIS — E871 Hypo-osmolality and hyponatremia: Secondary | ICD-10-CM | POA: Diagnosis present

## 2017-02-07 DIAGNOSIS — E222 Syndrome of inappropriate secretion of antidiuretic hormone: Secondary | ICD-10-CM | POA: Diagnosis not present

## 2017-02-07 DIAGNOSIS — I10 Essential (primary) hypertension: Secondary | ICD-10-CM | POA: Diagnosis present

## 2017-02-07 DIAGNOSIS — R0600 Dyspnea, unspecified: Secondary | ICD-10-CM

## 2017-02-07 DIAGNOSIS — I517 Cardiomegaly: Secondary | ICD-10-CM | POA: Diagnosis not present

## 2017-02-07 DIAGNOSIS — Z681 Body mass index (BMI) 19 or less, adult: Secondary | ICD-10-CM | POA: Diagnosis not present

## 2017-02-07 DIAGNOSIS — J479 Bronchiectasis, uncomplicated: Secondary | ICD-10-CM | POA: Diagnosis not present

## 2017-02-07 DIAGNOSIS — E860 Dehydration: Secondary | ICD-10-CM | POA: Diagnosis present

## 2017-02-07 DIAGNOSIS — F329 Major depressive disorder, single episode, unspecified: Secondary | ICD-10-CM | POA: Diagnosis not present

## 2017-02-07 LAB — URINALYSIS, ROUTINE W REFLEX MICROSCOPIC
BILIRUBIN URINE: NEGATIVE
Bacteria, UA: NONE SEEN
GLUCOSE, UA: NEGATIVE mg/dL
KETONES UR: NEGATIVE mg/dL
LEUKOCYTES UA: NEGATIVE
Nitrite: NEGATIVE
PH: 6.5 (ref 5.0–8.0)
Protein, ur: NEGATIVE mg/dL
Specific Gravity, Urine: <1.005 — ABNORMAL LOW (ref 1.005–1.030)
Squamous Epithelial / LPF: NONE SEEN

## 2017-02-07 LAB — BASIC METABOLIC PANEL
Anion gap: 9 (ref 5–15)
Anion gap: 9 (ref 5–15)
BUN: 9 mg/dL (ref 6–20)
BUN: 6 mg/dL (ref 6–20)
CALCIUM: 8.9 mg/dL (ref 8.9–10.3)
CALCIUM: 8.4 mg/dL — AB (ref 8.9–10.3)
CO2: 26 mmol/L (ref 22–32)
CO2: 24 mmol/L (ref 22–32)
CREATININE: 0.78 mg/dL (ref 0.44–1.00)
CREATININE: 0.61 mg/dL (ref 0.44–1.00)
Chloride: 75 mmol/L — ABNORMAL LOW (ref 101–111)
Chloride: 78 mmol/L — ABNORMAL LOW (ref 101–111)
GFR calc Af Amer: >60 mL/min (ref >60)
GFR calc non Af Amer: >60 mL/min (ref >60)
Glucose, Bld: 152 mg/dL — ABNORMAL HIGH (ref 65–99)
Glucose, Bld: 149 mg/dL — ABNORMAL HIGH (ref 65–99)
Potassium: 4.1 mmol/L (ref 3.5–5.1)
Potassium: 3.1 mmol/L — ABNORMAL LOW (ref 3.5–5.1)
SODIUM: 110 mmol/L — AB (ref 135–145)
Sodium: 111 mmol/L — CL (ref 135–145)

## 2017-02-07 LAB — I-STAT TROPONIN, ED: TROPONIN I, POC: 0.02 ng/mL (ref 0.00–0.08)

## 2017-02-07 LAB — HEPATIC FUNCTION PANEL
ALBUMIN: 3.9 g/dL (ref 3.5–5.0)
ALK PHOS: 95 U/L (ref 38–126)
ALT: 23 U/L (ref 14–54)
AST: 59 U/L — ABNORMAL HIGH (ref 15–41)
BILIRUBIN INDIRECT: 1.2 mg/dL — AB (ref 0.3–0.9)
BILIRUBIN TOTAL: 1.7 mg/dL — AB (ref 0.3–1.2)
Bilirubin, Direct: 0.5 mg/dL (ref 0.1–0.5)
Total Protein: 7.1 g/dL (ref 6.5–8.1)

## 2017-02-07 LAB — CBC
HCT: 35.6 % — ABNORMAL LOW (ref 36.0–46.0)
Hemoglobin: 13.2 g/dL (ref 12.0–15.0)
MCH: 29.5 pg (ref 26.0–34.0)
MCHC: 37.1 g/dL — ABNORMAL HIGH (ref 30.0–36.0)
MCV: 79.5 fL (ref 78.0–100.0)
PLATELETS: 276 10*3/uL (ref 150–400)
RBC: 4.48 MIL/uL (ref 3.87–5.11)
RDW: 12.3 % (ref 11.5–15.5)
WBC: 11.5 10*3/uL — AB (ref 4.0–10.5)

## 2017-02-07 LAB — CBG MONITORING, ED: Glucose-Capillary: 174 mg/dL — ABNORMAL HIGH (ref 65–99)

## 2017-02-07 LAB — SODIUM, URINE, RANDOM: SODIUM UR: 64 mmol/L

## 2017-02-07 LAB — MRSA PCR SCREENING: MRSA by PCR: NEGATIVE

## 2017-02-07 MED ORDER — ALBUTEROL SULFATE (2.5 MG/3ML) 0.083% IN NEBU: 3.0000 mL | INHALATION_SOLUTION | Freq: Four times a day (QID) | RESPIRATORY_TRACT | Status: DC | PRN

## 2017-02-07 MED ORDER — ENOXAPARIN SODIUM 40 MG/0.4ML ~~LOC~~ SOLN
40.0000 mg | SUBCUTANEOUS | Status: DC
  Administered 2017-02-07 – 2017-02-11 (×5): 40 mg via SUBCUTANEOUS
  Filled 2017-02-07 (×5): qty 0.4

## 2017-02-07 MED ORDER — SODIUM CHLORIDE 3 % IV SOLN
INTRAVENOUS | Status: DC
  Filled 2017-02-07: qty 500

## 2017-02-07 MED ORDER — SIMVASTATIN 40 MG PO TABS
40.0000 mg | ORAL_TABLET | Freq: Every day | ORAL | Status: DC
  Administered 2017-02-08 – 2017-02-12 (×5): 40 mg via ORAL
  Filled 2017-02-07 (×5): qty 1

## 2017-02-07 MED ORDER — SODIUM CHLORIDE 0.9 % IV BOLUS (SEPSIS)
500.0000 mL | Freq: Once | INTRAVENOUS | Status: AC
  Administered 2017-02-07: 500 mL via INTRAVENOUS

## 2017-02-07 MED ORDER — CARVEDILOL 3.125 MG PO TABS
3.1250 mg | ORAL_TABLET | Freq: Two times a day (BID) | ORAL | Status: DC
  Administered 2017-02-07 – 2017-02-08 (×2): 3.125 mg via ORAL
  Filled 2017-02-07 (×2): qty 1

## 2017-02-07 MED ORDER — SODIUM CHLORIDE 0.9% FLUSH
3.0000 mL | Freq: Two times a day (BID) | INTRAVENOUS | Status: DC
  Administered 2017-02-07 – 2017-02-11 (×4): 3 mL via INTRAVENOUS

## 2017-02-07 MED ORDER — ONDANSETRON HCL 4 MG PO TABS: 4.0000 mg | ORAL_TABLET | Freq: Four times a day (QID) | ORAL | Status: DC | PRN

## 2017-02-07 MED ORDER — ONDANSETRON HCL 4 MG/2ML IJ SOLN
4.0000 mg | Freq: Four times a day (QID) | INTRAMUSCULAR | Status: DC | PRN
  Administered 2017-02-08: 4 mg via INTRAVENOUS
  Filled 2017-02-07: qty 2

## 2017-02-07 MED ORDER — ACETAMINOPHEN 500 MG PO TABS
500.0000 mg | ORAL_TABLET | Freq: Four times a day (QID) | ORAL | Status: DC | PRN
  Administered 2017-02-10: 500 mg via ORAL
  Filled 2017-02-07: qty 1

## 2017-02-07 MED ORDER — SODIUM CHLORIDE 3 % IV SOLN
INTRAVENOUS | Status: DC
  Administered 2017-02-07: 30 mL/h via INTRAVENOUS
  Administered 2017-02-08: 20 mL/h via INTRAVENOUS
  Filled 2017-02-07 (×4): qty 500

## 2017-02-07 MED ORDER — HYDRALAZINE HCL 20 MG/ML IJ SOLN
5.0000 mg | Freq: Once | INTRAMUSCULAR | Status: AC
  Administered 2017-02-07: 5 mg via INTRAVENOUS
  Filled 2017-02-07: qty 1

## 2017-02-07 NOTE — ED Notes (Signed)
Report given to floor, will bring patient up at [facility]

## 2017-02-07 NOTE — Consult Note (Signed)
[facility] Consult Note     Date: 02/07/2017                  Patient Name:  [name] [name]  MRN: [id number]  DOB: 05/25/1935  Age / Sex: 82 y.o., female         PCP: Stephens Shire, MD                 Service Requesting Consult: [facility]                 Reason for Consult: hyponatremia            Chief Complaint: confusion and dizziness  HPI: Pt is an 51F with a PMH significant for HTN, HLD, dementia, ? Bronchiectasis and depression who is now seen in consultation at the request of Dr. Starla Link of [facility] for evaluation and recommendations surrounding hyponatremia.    History is provided by pt's granddaughter.  It appears that pt has had 1 week of increasing confusion and dizziness.  This is also accompanied by poor appetite which is more longstanding, at least 2-3 weeks.  Pt lives by herself and has family members check in on her.  She was last seen by her PCP on 01/21/2017 where her memory loss was discussed.  She was started on Namenda (memantine).  Refills were provided of bisoprolol-HCTZ and Lexapro as well as Xanax and coreg.    Granddaughter notes that when she came to the house today, there were no drinks (water, tea, coke) in her refrigerator.  She was confused and so she was brought to the ED.  No seizures witnessed.  In the ED, pt's Na was found to be 110.  NS 500 mL bolus given.    Past Medical History:  Diagnosis Date  . Heart murmur   . Hypercholesteremia   . Hypertension     Past Surgical History:  Procedure Laterality Date  . VIDEO BRONCHOSCOPY Bilateral 04/16/2015   Procedure: VIDEO BRONCHOSCOPY WITHOUT FLUORO;  Surgeon: Collene Gobble, MD;  Location: [facility];  Service: Cardiopulmonary;  Laterality: Bilateral;    Family History  Problem Relation Age of Onset  . Emphysema Father   . Brain cancer Brother    Social History:  reports that she has never smoked. She has never used smokeless tobacco. She reports that she  does not drink alcohol or use drugs.  Allergies: No Known Allergies   (Not in a hospital admission)  Results for orders placed or performed during the hospital encounter of 02/07/17 (from the past 48 hour(s))  CBG monitoring, ED     Status: Abnormal   Collection Time: 02/07/17  2:33 PM  Result Value Ref Range   Glucose-Capillary 174 (H) 65 - 99 mg/dL  Basic metabolic panel     Status: Abnormal   Collection Time: 02/07/17  3:29 PM  Result Value Ref Range   Sodium 110 (LL) 135 - 145 mmol/L    Comment: CRITICAL RESULT CALLED TO, READ BACK BY AND VERIFIED WITH: K MINGIA AT 1655 ON 06.23.2018 BY NBROOKS    Potassium 4.1 3.5 - 5.1 mmol/L   Chloride 75 (L) 101 - 111 mmol/L   CO2 26 22 - 32 mmol/L   Glucose, Bld 152 (H) 65 - 99 mg/dL   BUN 9 6 - 20 mg/dL   Creatinine, Ser 0.78 0.44 - 1.00 mg/dL   Calcium 8.9 8.9 - 10.3 mg/dL   GFR calc non Af Amer >60 >60 mL/min  GFR calc Af Amer >60 >60 mL/min    Comment: (NOTE) The eGFR has been calculated using the CKD EPI equation. This calculation has not been validated in all clinical situations. eGFR's persistently <60 mL/min signify possible Chronic Kidney Disease.    Anion gap 9 5 - 15  CBC     Status: Abnormal   Collection Time: 02/07/17  3:29 PM  Result Value Ref Range   WBC 11.5 (H) 4.0 - 10.5 K/uL   RBC 4.48 3.87 - 5.11 MIL/uL   Hemoglobin 13.2 12.0 - 15.0 g/dL   HCT 35.6 (L) 36.0 - 46.0 %   MCV 79.5 78.0 - 100.0 fL   MCH 29.5 26.0 - 34.0 pg   MCHC 37.1 (H) 30.0 - 36.0 g/dL    Comment: ROULEAUX   RDW 12.3 11.5 - 15.5 %   Platelets 276 150 - 400 K/uL  Urinalysis, Routine w reflex microscopic     Status: Abnormal   Collection Time: 02/07/17  3:29 PM  Result Value Ref Range   Color, Urine YELLOW YELLOW   APPearance CLEAR CLEAR   Specific Gravity, Urine <1.005 (L) 1.005 - 1.030   pH 6.5 5.0 - 8.0   Glucose, UA NEGATIVE NEGATIVE mg/dL   Hgb urine dipstick SMALL (A) NEGATIVE   Bilirubin Urine NEGATIVE NEGATIVE   Ketones, ur  NEGATIVE NEGATIVE mg/dL   Protein, ur NEGATIVE NEGATIVE mg/dL   Nitrite NEGATIVE NEGATIVE   Leukocytes, UA NEGATIVE NEGATIVE   RBC / HPF 0-5 0 - 5 RBC/hpf   WBC, UA 0-5 0 - 5 WBC/hpf   Bacteria, UA NONE SEEN NONE SEEN   Squamous Epithelial / LPF NONE SEEN NONE SEEN  Hepatic function panel     Status: Abnormal   Collection Time: 02/07/17  3:29 PM  Result Value Ref Range   Total Protein 7.1 6.5 - 8.1 g/dL   Albumin 3.9 3.5 - 5.0 g/dL   AST 59 (H) 15 - 41 U/L   ALT 23 14 - 54 U/L   Alkaline Phosphatase 95 38 - 126 U/L   Total Bilirubin 1.7 (H) 0.3 - 1.2 mg/dL   Bilirubin, Direct 0.5 0.1 - 0.5 mg/dL   Indirect Bilirubin 1.2 (H) 0.3 - 0.9 mg/dL  I-stat troponin, ED     Status: None   Collection Time: 02/07/17  3:39 PM  Result Value Ref Range   Troponin i, poc 0.02 0.00 - 0.08 ng/mL   Comment 3            Comment: Due to the release kinetics of cTnI, a negative result within the first hours of the onset of symptoms does not rule out myocardial infarction with certainty. If myocardial infarction is still suspected, repeat the test at appropriate intervals.    No results found.  ROS: all other systems reviewed and are negative as per HPI  Blood pressure (!) 174/81, pulse 63, temperature 97.7 F (36.5 C), temperature source Oral, resp. rate 17, height '5\' 3"'  (1.6 m), weight 46.7 kg (103 lb), SpO2 100 %. Physical Exam  GEN elderly, lying in bed, NAD, clearly confused HEENT tacky MM NECK no JVD PULM clear bilaterally, faint inspiratory rhonchi R base CV RRR no m/r/g ABD thin, NABS, nontender, nondistended, no HSM EXT no LE edema NEURO clearly confused, responds slowly to questions SKIN poor turgor  Assessment/Plan  1.  Hyponatremia: Na quite low and very clearly confused.  Underlying dementia; however family reports certainly not at baseline.  I suspect mixed picture of SIADH (  Lexapro, HCTZ), ? bronchiectasis + tea/toast.  Agree with hospitalist assessment for meeting criteria  for hypertonic saline.  Start at 30 mL/ hr, no bolus.  Q2 Na checks.  Correct no more than 6-8 mEq in 24 hrs (and really more on the 6 side).  I have placed orders for the hypertonic saline order set.  Sending urine osms and urine sodium along with serum osms  2.  HTN: do not recommend restarting HCTZ.  Confusion over which meds she's actually taking, greatly appreciate pharmacy med rec assistance.  3.  Dementia: rec stop memantine for now  4.  Depression: rec avoid SSRIs   Madelon Lips MD [facility] pgr [phone] 02/07/2017, 7:30 PM

## 2017-02-07 NOTE — ED Provider Notes (Signed)
[facility] DEPT [facility] Provider Note   CSN: [id number] Arrival date & time: 02/07/17  1403     History   Chief Complaint Chief Complaint  Patient presents with  . Dizziness  . Weakness    HPI [name] [name] is a 82 y.o. female.  HPI Patient presents with greater than 1 week of generalized weakness, fatigue and dizziness. Patient has difficulty describing dizziness. Worse with standing. Has had intermittent nausea. Denies vomiting, diarrhea or constipation. No urinary frequency, hesitancy, dysuria or hematuria. Denies chest pain, shortness of breath, abdominal pain. No headache, hearing changes or visual changes. Denies fever or chills. Decreased appetite and poor PO intake. Past Medical History:  Diagnosis Date  . Heart murmur   . Hypercholesteremia   . Hypertension     Patient Active Problem List   Diagnosis Date Noted  . Hyponatremia 02/07/2017  . Dementia 02/07/2017  . HTN (hypertension) 02/07/2017  . Diarrhea 12/03/2015  . Mycobacterium avium infection ([place]) 09/04/2015  . Hemoptysis 04/16/2015  . Bronchiectasis without acute exacerbation ([place]) 03/21/2011  . Chronic cough 02/18/2011  . Rhinitis 02/18/2011  . GERD (gastroesophageal reflux disease) 02/18/2011    Past Surgical History:  Procedure Laterality Date  . VIDEO BRONCHOSCOPY Bilateral 04/16/2015   Procedure: VIDEO BRONCHOSCOPY WITHOUT FLUORO;  Surgeon: Collene Gobble, MD;  Location: [facility];  Service: Cardiopulmonary;  Laterality: Bilateral;    OB History    No data available       Home Medications    Prior to Admission medications   Medication Sig Start Date End Date Taking? Authorizing Provider  acetaminophen (TYLENOL) 500 MG tablet Take 500 mg by mouth every 6 (six) hours as needed for mild pain.    Yes [provider]  ALPRAZolam (XANAX) 0.5 MG tablet 1/2 - 1 TABLET BY MOUTH TWICE A DAY AS NEEDED 01/21/17  Yes [provider]  bisoprolol-hydrochlorothiazide (ZIAC) 5-6.25  MG tablet Take 1 tablet by mouth daily. 01/21/17  Yes [provider]  carvedilol (COREG) 3.125 MG tablet Take 3.125 mg by mouth 2 (two) times daily with a meal.   Yes [provider]  escitalopram (LEXAPRO) 10 MG tablet Take 10 mg by mouth daily. 01/21/17  Yes [provider]  memantine (NAMENDA) 5 MG tablet Take 10 mg by mouth 2 (two) times daily. 01/21/17  Yes [provider]  simvastatin (ZOCOR) 40 MG tablet Take 40 mg by mouth daily. Once daily 12/19/10  Yes [provider]  albuterol (PROVENTIL HFA;VENTOLIN HFA) 108 (90 Base) MCG/ACT inhaler Inhale 2 puffs into the lungs every 6 (six) hours as needed for wheezing or shortness of breath. Patient not taking: Reported on 02/07/2017 07/07/16   Collene Gobble, MD  fluticasone [facility]) 50 MCG/ACT nasal spray Place 2 sprays into both nostrils daily. Patient not taking: Reported on 02/07/2017 09/04/15   Collene Gobble, MD    Family History Family History  Problem Relation Age of Onset  . Emphysema Father   . Brain cancer Brother     Social History Social History  Substance Use Topics  . Smoking status: Never Smoker  . Smokeless tobacco: Never Used  . Alcohol use No     Allergies   Patient has no known allergies.   Review of Systems Review of Systems  Constitutional: Positive for activity change, appetite change and fatigue. Negative for chills and fever.  HENT: Negative for trouble swallowing and voice change.   Eyes: Negative for visual disturbance.  Respiratory: Negative for cough  and shortness of breath.   Cardiovascular: Negative for chest pain, palpitations and leg swelling.  Gastrointestinal: Positive for nausea. Negative for abdominal pain, constipation, diarrhea and vomiting.  Genitourinary: Negative for dysuria, flank pain, frequency, hematuria and pelvic pain.  Musculoskeletal: Negative for back pain, myalgias, neck pain and neck stiffness.  Skin: Negative for pallor, rash and  wound.  Neurological: Positive for dizziness, weakness (generalized) and light-headedness. Negative for syncope, numbness and headaches.  Psychiatric/Behavioral: Positive for confusion.  All other systems reviewed and are negative.    Physical Exam Updated Vital Signs BP (!) 156/66   Pulse 89   Temp 98 F (36.7 C) (Oral)   Resp 20   Ht 5\' 3"  (1.6 m)   Wt 47.7 kg (105 lb 2.6 oz)   SpO2 97%   BMI 18.63 kg/m   Physical Exam  Constitutional: She is oriented to person, place, and time. She appears well-developed and well-nourished. No distress.  HENT:  Head: Normocephalic and atraumatic.  Dry mucous membranes  Eyes: EOM are normal. Pupils are equal, round, and reactive to light.  No nystagmus appreciated  Neck: Normal range of motion. Neck supple.  No meningismus  Cardiovascular: Normal rate and regular rhythm.  Exam reveals no gallop and no friction rub.   No murmur heard. Pulmonary/Chest: Effort normal and breath sounds normal. No respiratory distress. She has no wheezes. She has no rales. She exhibits no tenderness.  Abdominal: Soft. Bowel sounds are normal. There is no tenderness. There is no rebound and no guarding.  Musculoskeletal: Normal range of motion. She exhibits no edema or tenderness.  No lower extremity swelling, asymmetry or tenderness. Distal pulses are 2+.  Neurological: She is alert and oriented to person, place, and time.  Mild confusion with following commands. 5/5 motor in all extremities. Sensation intact. Bilateral finger to nose intact.  Skin: Skin is warm and dry. Capillary refill takes less than 2 seconds. No rash noted. No erythema.  Psychiatric: She has a normal mood and affect. Her behavior is normal.  Nursing note and vitals reviewed.    ED Treatments / Results  Labs (all labs ordered are listed, but only abnormal results are displayed) Labs Reviewed  BASIC METABOLIC PANEL - Abnormal; Notable for the following:       Result Value   Sodium 110  (*)    Chloride 75 (*)    Glucose, Bld 152 (*)    All other components within normal limits  CBC - Abnormal; Notable for the following:    WBC 11.5 (*)    HCT 35.6 (*)    MCHC 37.1 (*)    All other components within normal limits  URINALYSIS, ROUTINE W REFLEX MICROSCOPIC - Abnormal; Notable for the following:    Specific Gravity, Urine <1.005 (*)    Hgb urine dipstick SMALL (*)    All other components within normal limits  HEPATIC FUNCTION PANEL - Abnormal; Notable for the following:    AST 59 (*)    Total Bilirubin 1.7 (*)    Indirect Bilirubin 1.2 (*)    All other components within normal limits  OSMOLALITY, URINE - Abnormal; Notable for the following:    Osmolality, Ur 233 (*)    All other components within normal limits  OSMOLALITY - Abnormal; Notable for the following:    Osmolality 236 (*)    All other components within normal limits  SODIUM - Abnormal; Notable for the following:    Sodium 111 (*)    All other  components within normal limits  SODIUM - Abnormal; Notable for the following:    Sodium 113 (*)    All other components within normal limits  BASIC METABOLIC PANEL - Abnormal; Notable for the following:    Sodium 111 (*)    Potassium 2.9 (*)    Chloride 77 (*)    Glucose, Bld 157 (*)    Calcium 8.5 (*)    All other components within normal limits  BASIC METABOLIC PANEL - Abnormal; Notable for the following:    Sodium 113 (*)    Potassium 3.1 (*)    Chloride 79 (*)    Glucose, Bld 147 (*)    Calcium 8.5 (*)    All other components within normal limits  BASIC METABOLIC PANEL - Abnormal; Notable for the following:    Sodium 113 (*)    Potassium 3.0 (*)    Chloride 80 (*)    Glucose, Bld 134 (*)    Calcium 8.5 (*)    All other components within normal limits  BASIC METABOLIC PANEL - Abnormal; Notable for the following:    Sodium 115 (*)    Potassium 3.2 (*)    Chloride 81 (*)    CO2 21 (*)    Glucose, Bld 179 (*)    BUN 5 (*)    Calcium 8.6 (*)     All other components within normal limits  CBC - Abnormal; Notable for the following:    WBC 11.2 (*)    MCHC 37.0 (*)    All other components within normal limits  OSMOLALITY, URINE - Abnormal; Notable for the following:    Osmolality, Ur 231 (*)    All other components within normal limits  BASIC METABOLIC PANEL - Abnormal; Notable for the following:    Sodium 115 (*)    Chloride 80 (*)    Glucose, Bld 152 (*)    Calcium 8.7 (*)    All other components within normal limits  BASIC METABOLIC PANEL - Abnormal; Notable for the following:    Sodium 111 (*)    Potassium 3.1 (*)    Chloride 78 (*)    Glucose, Bld 149 (*)    Calcium 8.4 (*)    All other components within normal limits  OSMOLALITY - Abnormal; Notable for the following:    Osmolality 232 (*)    All other components within normal limits  RENAL FUNCTION PANEL - Abnormal; Notable for the following:    Sodium 116 (*)    Chloride 83 (*)    Glucose, Bld 138 (*)    Phosphorus 1.6 (*)    All other components within normal limits  CBG MONITORING, ED - Abnormal; Notable for the following:    Glucose-Capillary 174 (*)    All other components within normal limits  MRSA PCR SCREENING  TSH  SODIUM, URINE, RANDOM  SODIUM, URINE, RANDOM  BASIC METABOLIC PANEL  COMPREHENSIVE METABOLIC PANEL  BASIC METABOLIC PANEL  BASIC METABOLIC PANEL  BASIC METABOLIC PANEL  I-STAT TROPOININ, ED    EKG  EKG Interpretation  Date/Time:  Saturday February 07 2017 14:25:09 EDT Ventricular Rate:  68 PR Interval:    QRS Duration: 93 QT Interval:  436 QTC Calculation: 464 R Axis:   87 Text Interpretation:  Sinus rhythm Atrial premature complex Borderline right axis deviation Baseline wander in lead(s) II III aVF Confirmed by Lita Mains  MD, Deacon Gadbois ([id number]) on 02/07/2017 3:13:04 PM       Radiology No results found.  Procedures  Procedures (including critical care time)  Medications Ordered in ED Medications  albuterol (PROVENTIL) (2.5  MG/3ML) 0.083% nebulizer solution 3 mL (not administered)  acetaminophen (TYLENOL) tablet 500 mg (not administered)  simvastatin (ZOCOR) tablet 40 mg (40 mg Oral Given 02/08/17 1101)  enoxaparin (LOVENOX) injection 40 mg (40 mg Subcutaneous Given 02/08/17 2259)  sodium chloride flush (NS) 0.9 % injection 3 mL (3 mLs Intravenous Not Given 02/08/17 2301)  ondansetron (ZOFRAN) tablet 4 mg ( Oral See Alternative 02/08/17 0838)    Or  ondansetron (ZOFRAN) injection 4 mg (4 mg Intravenous Given by Other 02/08/17 0838)  sodium chloride (hypertonic) 3 % solution ( Intravenous Rate/Dose Verify 02/08/17 2200)  carvedilol (COREG) tablet 6.25 mg (6.25 mg Oral Given 02/08/17 1716)  sodium chloride 0.9 % bolus 500 mL (0 mLs Intravenous Stopped 02/07/17 1832)  hydrALAZINE (APRESOLINE) injection 5 mg (5 mg Intravenous Given 02/07/17 2145)  potassium chloride SA (K-DUR,KLOR-CON) CR tablet 80 mEq (80 mEq Oral Given 02/08/17 0820)   CRITICAL CARE Performed by: Lita Mains, Brant Peets Total critical care time: 25 minutes Critical care time was exclusive of separately billable procedures and treating other patients. Critical care was necessary to treat or prevent imminent or life-threatening deterioration. Critical care was time spent personally by me on the following activities: development of treatment plan with patient and/or surrogate as well as nursing, discussions with consultants, evaluation of patient's response to treatment, examination of patient, obtaining history from patient or surrogate, ordering and performing treatments and interventions, ordering and review of laboratory studies, ordering and review of radiographic studies, pulse oximetry and re-evaluation of patient's condition.   Initial Impression / Assessment and Plan / ED Course  I have reviewed the triage vital signs and the nursing notes.  Pertinent labs & imaging results that were available during my care of the patient were reviewed by me and considered  in my medical decision making (see chart for details).    Patient with significant hyponatremia. No recent serum sodium levels. Patient with mild confusion at baseline which may be worse per family. Discussed with hospitalist. They will consult nephrology regarding possible hypertonic saline treatment.   Final Clinical Impressions(s) / ED Diagnoses   Final diagnoses:  Hyponatremia    New Prescriptions Current Discharge Medication List       Julianne Rice, MD 02/08/17 2327

## 2017-02-07 NOTE — ED Triage Notes (Signed)
Pt with weakness and dizziness for past week.  Decreased appetite.  No fever.  No n/v/d.  No pain.

## 2017-02-07 NOTE — H&P (Signed)
History and Physical    [name] PJA:[id number] DOB: [birth year] DOA: 02/07/2017  PCP: Stephens Shire, MD   Patient coming from: Home  I have personally briefly reviewed patient's old medical records in [place]  Chief Complaint: Weakness and dizziness   HPI: [name] [name] is a 82 y.o. female with medical history significant of recent diagnosis of dementia, hypertension, dyslipidemia, some sort of lung problem (patient and family members unaware of the diagnosis; although records indicate bronchiectasis) presented with worsening weakness and dizziness. Patient is a poor historian and and is confused. Reliability of the history is poor. Family members present at bedside don't live the patient. As per the family members, patient was recently diagnosed with dementia few weeks ago and was started on a medication. Patient's appetite has been very poor over the last 2-3 weeks. Patient has become progressively very weak and today she was very weak and dizzy so she was brought to the emergency room. Patient denies any history of recent fall, trauma to head, chest pain, palpitations, nausea, vomiting, diarrhea, excessive urination or burning urination, fever. Family has noticed some weight loss as well. Mental status apparently was slightly worse today as per the family. No witnessed seizures.  ED Course: Patient's serum sodium was found to be 110; normal saline was ordered and hospitalist service was called to evaluate the patient.   Review of Systems: Could not be accurately obtained because of patient's confusion  Past Medical History:  Diagnosis Date  . Heart murmur   . Hypercholesteremia   . Hypertension     Past Surgical History:  Procedure Laterality Date  . VIDEO BRONCHOSCOPY Bilateral 04/16/2015   Procedure: VIDEO BRONCHOSCOPY WITHOUT FLUORO;  Surgeon: Collene Gobble, MD;  Location: [facility];  Service: Cardiopulmonary;  Laterality: Bilateral;   Social history  reports that she has never smoked. She has never used smokeless tobacco. She reports that she does not drink alcohol or use drugs.  She lives at home alone  No Known Allergies  Family History  Problem Relation Age of Onset  . Emphysema Father   . Brain cancer Brother     Prior to Admission medications   Medication Sig Start Date End Date Taking? Authorizing Provider  acetaminophen (TYLENOL) 500 MG tablet Take 500 mg by mouth every 6 (six) hours as needed for mild pain.    Yes [provider]  carvedilol (COREG) 3.125 MG tablet Take 3.125 mg by mouth 2 (two) times daily with a meal.   Yes [provider]  simvastatin (ZOCOR) 40 MG tablet Take 40 mg by mouth daily. Once daily 12/19/10  Yes [provider]  albuterol (PROVENTIL HFA;VENTOLIN HFA) 108 (90 Base) MCG/ACT inhaler Inhale 2 puffs into the lungs every 6 (six) hours as needed for wheezing or shortness of breath. Patient not taking: Reported on 02/07/2017 07/07/16   Collene Gobble, MD  fluticasone [facility]) 50 MCG/ACT nasal spray Place 2 sprays into both nostrils daily. Patient not taking: Reported on 02/07/2017 09/04/15   Collene Gobble, MD    Physical Exam: Vitals:   02/07/17 1422 02/07/17 1615 02/07/17 1800  BP: (!) 162/62 (!) 173/69 (!) 190/99  Pulse: 66 (!) 115 64  Resp: 20 17 15   Temp: 97.7 F (36.5 C)    TempSrc: Oral    SpO2: 100% 98% 98%  Weight: 46.7 kg (103 lb)    Height: 5\' 3"  (1.6 m)      Constitutional: NAD, calm, comfortable Vitals:  02/07/17 1422 02/07/17 1615 02/07/17 1800  BP: (!) 162/62 (!) 173/69 (!) 190/99  Pulse: 66 (!) 115 64  Resp: 20 17 15   Temp: 97.7 F (36.5 C)    TempSrc: Oral    SpO2: 100% 98% 98%  Weight: 46.7 kg (103 lb)    Height: 5\' 3"  (1.6 m)     Eyes: PERRL, lids and conjunctivae normal ENMT: Mucous membranes are Dry. Posterior pharynx clear of any exudate or lesions.Normal dentition.  Neck: normal, supple, no masses, no thyromegaly Respiratory:  Bilateral decreased breath sounds at bases. No tachypnea  Cardiovascular: Regular rate and rhythm, no murmurs / rubs / gallops. No extremity edema. 2+ pedal pulses. No carotid bruits.  Abdomen: no tenderness, no masses palpated. No hepatosplenomegaly. Bowel sounds positive.  Musculoskeletal: no clubbing / cyanosis. No joint deformity upper and lower extremities. Skin: no rashes, lesions, ulcers. No induration Neurologic: CN 2-12 grossly intact. Sensation intact. Strength 4/5 in all 4. Confused to time. Psychiatric: Couldn't be performed because of slight confusion   Labs on Admission: I have personally reviewed following labs and imaging studies  CBC:  Recent Labs Lab 02/07/17 1529  WBC 11.5*  HGB 13.2  HCT 35.6*  MCV 79.5  PLT 650   Basic Metabolic Panel:  Recent Labs Lab 02/07/17 1529  NA 110*  K 4.1  CL 75*  CO2 26  GLUCOSE 152*  BUN 9  CREATININE 0.78  CALCIUM 8.9   GFR: Estimated Creatinine Clearance: 41.3 mL/min (by C-G formula based on SCr of 0.78 mg/dL). Liver Function Tests:  Recent Labs Lab 02/07/17 1529  AST 59*  ALT 23  ALKPHOS 95  BILITOT 1.7*  PROT 7.1  ALBUMIN 3.9   No results for input(s): LIPASE, AMYLASE in the last 168 hours. No results for input(s): AMMONIA in the last 168 hours. Coagulation Profile: No results for input(s): INR, PROTIME in the last 168 hours. Cardiac Enzymes: No results for input(s): CKTOTAL, CKMB, CKMBINDEX, TROPONINI in the last 168 hours. BNP (last 3 results) No results for input(s): PROBNP in the last 8760 hours. HbA1C: No results for input(s): HGBA1C in the last 72 hours. CBG:  Recent Labs Lab 02/07/17 1433  GLUCAP 174*   Lipid Profile: No results for input(s): CHOL, HDL, LDLCALC, TRIG, CHOLHDL, LDLDIRECT in the last 72 hours. Thyroid Function Tests: No results for input(s): TSH, T4TOTAL, FREET4, T3FREE, THYROIDAB in the last 72 hours. Anemia Panel: No results for input(s): VITAMINB12, FOLATE, FERRITIN,  TIBC, IRON, RETICCTPCT in the last 72 hours. Urine analysis:    Component Value Date/Time   COLORURINE YELLOW 02/07/2017 1529   APPEARANCEUR CLEAR 02/07/2017 1529   LABSPEC <1.005 (L) 02/07/2017 1529   PHURINE 6.5 02/07/2017 1529   GLUCOSEU NEGATIVE 02/07/2017 1529   HGBUR SMALL (A) 02/07/2017 1529   BILIRUBINUR NEGATIVE 02/07/2017 1529   KETONESUR NEGATIVE 02/07/2017 1529   PROTEINUR NEGATIVE 02/07/2017 1529   UROBILINOGEN 0.2 10/11/2009 1222   NITRITE NEGATIVE 02/07/2017 1529   LEUKOCYTESUR NEGATIVE 02/07/2017 1529    Radiological Exams on Admission: No results found.  EKG: Reviewed by myself; normal sinus rhythm at 68 bpm. No ST elevations or depressions  Assessment/Plan Principal Problem:   Hyponatremia Active Problems:   Dementia   HTN (hypertension)  (please populate well all problems here in Problem List. (For example, if patient is on BP meds at home and you resume or decide to hold them, it is a problem that needs to be her. Same for CAD, COPD, HLD and so on)  Acute hyponatremia with hypochloremia - Probably secondary to dehydration and very poor oral intake and diuretic use including hydrochlorothiazide. - Admit the patient to stepdown unit. Spoke to nephrologist on call Dr.Upton and discussed the case with her. Patient will be started on 3% saline at 30 mL's per hour. BMP every 2 hourly.  - Stat serum osmolality and urine osmolality.  - Hold hydrochlorothiazide. Will follow-up further recommendations from nephrology. - Neuro checks every 4 hourly  Dehydration and very poor oral intake - Questionable cause. If problem persists, then we might have to proceed further imaging studies including CAT scan to rule out malignancy.  Mild leukocytosis Probably reactive. Monitor off antibiotics. Repeat a.m. Labs  Hypertension Blood pressure on the higher side. Monitor blood pressure. Hold diuretics for now. Continue Coreg  Dyslipidemia Continue  simvastatin  Dementia Monitor mental status. Fall precautions. PT evaluation. Care management consult for home health needs  Mild transaminitis with elevated bilirubin Probably from starvation. Repeat LFTs in a.m.  DVT prophylaxis: Lovenox  Code Status: DO NOT RESUSCITATE Family Communication: Discussed with family present at bedside  Disposition Plan: Home in 2-4 days  Consults called: Nephrology Admission status: Inpatient in stepdown unit    Aline August MD [facility] Pager [phone]  If 7PM-7AM, please contact night-coverage [email or web address] Password [facility]  02/07/2017, 6:09 PM

## 2017-02-08 LAB — BASIC METABOLIC PANEL
ANION GAP: 10 (ref 5–15)
ANION GAP: 10 (ref 5–15)
ANION GAP: 11 (ref 5–15)
ANION GAP: 13 (ref 5–15)
Anion gap: 9 (ref 5–15)
BUN: 6 mg/dL (ref 6–20)
BUN: 6 mg/dL (ref 6–20)
BUN: 5 mg/dL — ABNORMAL LOW (ref 6–20)
BUN: 6 mg/dL (ref 6–20)
BUN: 6 mg/dL (ref 6–20)
CALCIUM: 8.5 mg/dL — AB (ref 8.9–10.3)
CALCIUM: 8.5 mg/dL — AB (ref 8.9–10.3)
CALCIUM: 8.6 mg/dL — AB (ref 8.9–10.3)
CALCIUM: 8.7 mg/dL — AB (ref 8.9–10.3)
CHLORIDE: 77 mmol/L — AB (ref 101–111)
CHLORIDE: 79 mmol/L — AB (ref 101–111)
CHLORIDE: 80 mmol/L — AB (ref 101–111)
CO2: 23 mmol/L (ref 22–32)
CO2: 24 mmol/L (ref 22–32)
CO2: 21 mmol/L — ABNORMAL LOW (ref 22–32)
CO2: 23 mmol/L (ref 22–32)
CO2: 26 mmol/L (ref 22–32)
CREATININE: 0.48 mg/dL (ref 0.44–1.00)
CREATININE: 0.5 mg/dL (ref 0.44–1.00)
CREATININE: 0.58 mg/dL (ref 0.44–1.00)
Calcium: 8.5 mg/dL — ABNORMAL LOW (ref 8.9–10.3)
Chloride: 81 mmol/L — ABNORMAL LOW (ref 101–111)
Chloride: 80 mmol/L — ABNORMAL LOW (ref 101–111)
Creatinine, Ser: 0.51 mg/dL (ref 0.44–1.00)
Creatinine, Ser: 0.58 mg/dL (ref 0.44–1.00)
GFR calc Af Amer: >60 mL/min (ref >60)
GFR calc Af Amer: >60 mL/min (ref >60)
GFR calc non Af Amer: >60 mL/min (ref >60)
GFR calc non Af Amer: >60 mL/min (ref >60)
GFR calc non Af Amer: >60 mL/min (ref >60)
GFR calc non Af Amer: >60 mL/min (ref >60)
GFR calc non Af Amer: >60 mL/min (ref >60)
Glucose, Bld: 157 mg/dL — ABNORMAL HIGH (ref 65–99)
Glucose, Bld: 147 mg/dL — ABNORMAL HIGH (ref 65–99)
Glucose, Bld: 134 mg/dL — ABNORMAL HIGH (ref 65–99)
Glucose, Bld: 179 mg/dL — ABNORMAL HIGH (ref 65–99)
Glucose, Bld: 152 mg/dL — ABNORMAL HIGH (ref 65–99)
POTASSIUM: 2.9 mmol/L — AB (ref 3.5–5.1)
Potassium: 3.1 mmol/L — ABNORMAL LOW (ref 3.5–5.1)
Potassium: 3 mmol/L — ABNORMAL LOW (ref 3.5–5.1)
Potassium: 3.2 mmol/L — ABNORMAL LOW (ref 3.5–5.1)
Potassium: 3.8 mmol/L (ref 3.5–5.1)
SODIUM: 111 mmol/L — AB (ref 135–145)
SODIUM: 113 mmol/L — AB (ref 135–145)
SODIUM: 115 mmol/L — AB (ref 135–145)
Sodium: 113 mmol/L — CL (ref 135–145)
Sodium: 115 mmol/L — CL (ref 135–145)

## 2017-02-08 LAB — SODIUM, URINE, RANDOM: SODIUM UR: 65 mmol/L

## 2017-02-08 LAB — CBC
HCT: 36.5 % (ref 36.0–46.0)
Hemoglobin: 13.5 g/dL (ref 12.0–15.0)
MCH: 29.4 pg (ref 26.0–34.0)
MCHC: 37 g/dL — ABNORMAL HIGH (ref 30.0–36.0)
MCV: 79.5 fL (ref 78.0–100.0)
PLATELETS: 268 10*3/uL (ref 150–400)
RBC: 4.59 MIL/uL (ref 3.87–5.11)
RDW: 12 % (ref 11.5–15.5)
WBC: 11.2 10*3/uL — AB (ref 4.0–10.5)

## 2017-02-08 LAB — RENAL FUNCTION PANEL
ANION GAP: 8 (ref 5–15)
Albumin: 3.7 g/dL (ref 3.5–5.0)
BUN: 6 mg/dL (ref 6–20)
CALCIUM: 8.9 mg/dL (ref 8.9–10.3)
CO2: 25 mmol/L (ref 22–32)
CREATININE: 0.57 mg/dL (ref 0.44–1.00)
Chloride: 83 mmol/L — ABNORMAL LOW (ref 101–111)
GFR calc Af Amer: >60 mL/min (ref >60)
GFR calc non Af Amer: >60 mL/min (ref >60)
Glucose, Bld: 138 mg/dL — ABNORMAL HIGH (ref 65–99)
PHOSPHORUS: 1.6 mg/dL — AB (ref 2.5–4.6)
Potassium: 4.1 mmol/L (ref 3.5–5.1)
Sodium: 116 mmol/L — CL (ref 135–145)

## 2017-02-08 LAB — OSMOLALITY
OSMOLALITY: 232 mosm/kg — AB (ref 275–295)
Osmolality: 236 mOsm/kg — CL (ref 275–295)

## 2017-02-08 LAB — OSMOLALITY, URINE
OSMOLALITY UR: 231 mosm/kg — AB (ref 300–900)
Osmolality, Ur: 233 mOsm/kg — ABNORMAL LOW (ref 300–900)

## 2017-02-08 LAB — SODIUM
SODIUM: 111 mmol/L — AB (ref 135–145)
Sodium: 113 mmol/L — CL (ref 135–145)

## 2017-02-08 LAB — TSH: TSH: 1.053 u[IU]/mL (ref 0.350–4.500)

## 2017-02-08 MED ORDER — POTASSIUM CHLORIDE CRYS ER 20 MEQ PO TBCR
80.0000 meq | EXTENDED_RELEASE_TABLET | Freq: Once | ORAL | Status: AC
  Administered 2017-02-08: 80 meq via ORAL
  Filled 2017-02-08: qty 4

## 2017-02-08 MED ORDER — CARVEDILOL 6.25 MG PO TABS
6.2500 mg | ORAL_TABLET | Freq: Two times a day (BID) | ORAL | Status: DC
  Administered 2017-02-08 – 2017-02-10 (×4): 6.25 mg via ORAL
  Filled 2017-02-08 (×4): qty 1

## 2017-02-08 NOTE — Progress Notes (Signed)
CRITICAL VALUE ALERT  Critical Value:  Na 115  Date & Time Notied: 02/08/17 at 0934  Provider Notified:Dr, Hollie Salk  Orders Received/Actions taken: Yes.

## 2017-02-08 NOTE — Progress Notes (Signed)
Patient ID: [name] [name], female   DOB: 20-May-1935, 82 y.o.   MRN: [id number]  PROGRESS NOTE    [name] [name]  HMC:[id number] DOB: [birth year] DOA: 02/07/2017 PCP: Stephens Shire, MD   Brief Narrative:  81 year female with history of recent diagnosis of dementia, hypertension, dyslipidemia, some sort of lung problem (patient and family members unaware of the diagnosis; although records indicate bronchiectasis) presented with weakness and dizziness and was found to have sodium of 110. She was admitted with hypertonic saline. Nephrology has evaluated the patient  Assessment & Plan:   Principal Problem:   Hyponatremia Active Problems:   Dementia   HTN (hypertension)   Acute hyponatremia with hypochloremia - Probably secondary to dehydration and very poor oral intake and diuretic use including hydrochlorothiazide. - Sodium level slightly improved. Nephrology consultation appreciated. Continue monitoring every 2 hours BMP and 3% saline as per nephrology recommendations - Hold hydrochlorothiazide. - Neuro checks every 4 hourly  Dehydration and very poor oral intake - Questionable cause. If problem persists, then we might have to proceed further imaging studies including CAT scan to rule out malignancy. - Dietary evaluation  Mild leukocytosis Probably reactive. Monitor off antibiotics. Repeat a.m. Labs  Hypertension Blood pressure on the higher side. Monitor blood pressure. Hold diuretics for now. Continue Coreg  Dyslipidemia Continue simvastatin  Dementia Monitor mental status. Fall precautions. PT evaluation. Care management consult for home health needs  Mild transaminitis with elevated bilirubin Probably from starvation. Repeat LFTs in a.m.   DVT prophylaxis: Lovenox  Code Status: DO NOT RESUSCITATE Family Communication:  no family present at bedside  Disposition Plan: Home in 2-4 days   Consultants: Nephrology  Procedures: None  Antimicrobials:  None   Subjective: Patient seen and examined at bedside. She complains of intermittent nausea but no overnight fever, chest pain, shortness of breath, diarrhea  Objective: Vitals:   02/08/17 0600 02/08/17 0700 02/08/17 0800 02/08/17 1000  BP: (!) 155/57 (!) 152/62 (!) 165/52 (!) 145/52  Pulse: 67 72 79 70  Resp: 19 20  20   Temp:   97.4 F (36.3 C)   TempSrc:   Oral   SpO2: 97% 99% 99% 92%  Weight:      Height:        Intake/Output Summary (Last 24 hours) at 02/08/17 1250 Last data filed at 02/08/17 1100  Gross per 24 hour  Intake             1010 ml  Output             1200 ml  Net             -190 ml   Filed Weights   02/07/17 1422 02/08/17 0502  Weight: 46.7 kg (103 lb) 47.7 kg (105 lb 2.6 oz)    Examination:  General exam: Appears calm and comfortable  Respiratory system: Bilateral decreased breath sound at bases Cardiovascular system: S1 & S2 heard, Rate controlled  Gastrointestinal system: Abdomen is nondistended, soft and nontender. Normal bowel sounds heard. Extremities: No cyanosis, clubbing, edema    Data Reviewed: I have personally reviewed following labs and imaging studies  CBC:  Recent Labs Lab 02/07/17 1529 02/08/17 0338  WBC 11.5* 11.2*  HGB 13.2 13.5  HCT 35.6* 36.5  MCV 79.5 79.5  PLT 276 662   Basic Metabolic Panel:  Recent Labs Lab 02/08/17 0038 02/08/17 0338 02/08/17 0652 02/08/17 0834 02/08/17 1104  NA 111*  111* 113*  113* 113* 115* 115*  K 2.9* 3.1* 3.0* 3.2* 3.8  CL 77* 79* 80* 81* 80*  CO2 23 24 23  21* 26  GLUCOSE 157* 147* 134* 179* 152*  BUN 6 6 6  5* 6  CREATININE 0.48 0.50 0.51 0.58 0.58  CALCIUM 8.5* 8.5* 8.5* 8.6* 8.7*   GFR: Estimated Creatinine Clearance: 42.2 mL/min (by C-G formula based on SCr of 0.58 mg/dL). Liver Function Tests:  Recent Labs Lab 02/07/17 1529  AST 59*  ALT 23  ALKPHOS 95  BILITOT 1.7*  PROT 7.1  ALBUMIN 3.9   No results for input(s): LIPASE, AMYLASE in the last 168 hours. No  results for input(s): AMMONIA in the last 168 hours. Coagulation Profile: No results for input(s): INR, PROTIME in the last 168 hours. Cardiac Enzymes: No results for input(s): CKTOTAL, CKMB, CKMBINDEX, TROPONINI in the last 168 hours. BNP (last 3 results) No results for input(s): PROBNP in the last 8760 hours. HbA1C: No results for input(s): HGBA1C in the last 72 hours. CBG:  Recent Labs Lab 02/07/17 1433  GLUCAP 174*   Lipid Profile: No results for input(s): CHOL, HDL, LDLCALC, TRIG, CHOLHDL, LDLDIRECT in the last 72 hours. Thyroid Function Tests:  Recent Labs  02/08/17 0338  TSH 1.053   Anemia Panel: No results for input(s): VITAMINB12, FOLATE, FERRITIN, TIBC, IRON, RETICCTPCT in the last 72 hours. Sepsis Labs: No results for input(s): PROCALCITON, LATICACIDVEN in the last 168 hours.  Recent Results (from the past 240 hour(s))  MRSA PCR Screening     Status: None   Collection Time: 02/07/17  8:24 PM  Result Value Ref Range Status   MRSA by PCR NEGATIVE NEGATIVE Final    Comment:        The GeneXpert MRSA Assay (FDA approved for NASAL specimens only), is one component of a comprehensive MRSA colonization surveillance program. It is not intended to diagnose MRSA infection nor to guide or monitor treatment for MRSA infections.          Radiology Studies: No results found.      Scheduled Meds: . carvedilol  6.25 mg Oral BID WC  . enoxaparin (LOVENOX) injection  40 mg Subcutaneous Q24H  . simvastatin  40 mg Oral Daily  . sodium chloride flush  3 mL Intravenous Q12H   Continuous Infusions: . sodium chloride (hypertonic) 20 mL/hr (02/08/17 1202)     LOS: 1 day        Aline August, MD [facility] Pager [phone]  If 7PM-7AM, please contact night-coverage [email or web address] Password [facility] 02/08/2017, 12:50 PM

## 2017-02-08 NOTE — Progress Notes (Signed)
Subjective:  No signif events overnight- BP mostly high- getting 3%saline at 30 per hour- sodium up to 115 at last check  Objective Vital signs in last 24 hours: Vitals:   02/08/17 0502 02/08/17 0600 02/08/17 0700 02/08/17 0800  BP:  (!) 155/57 (!) 152/62   Pulse:  67 72   Resp:  19 20   Temp: 97.9 F (36.6 C)   97.4 F (36.3 C)  TempSrc: Oral   Oral  SpO2:  97% 99%   Weight: 47.7 kg (105 lb 2.6 oz)     Height:       Weight change:   Intake/Output Summary (Last 24 hours) at 02/08/17 1145 Last data filed at 02/08/17 0600  Gross per 24 hour  Intake             1010 ml  Output              900 ml  Net              110 ml   1. Hyponatremia - hypovolemic to euvolemic but given high BP do not think is dry.  sodiumvery low and confused.  No baseline labs for review.  Underlying dementia; however family reports certainly not at baseline.  Suspect mixed picture of SIADH (Lexapro, HCTZ), ? bronchiectasis + tea/toast.  her urine osm is low so she is trying to dilute urine- may not be able to dilute more.  Agree with  hypertonic saline.  Started at 30 mL/ hr, no bolus.  Q2 Na checks.  Correct no more than 6-8 mEq in 24 hrs (and really more on the 6 side)- from 110 to 115 in 19 hours so rate was decreased to 20 per hour.. Lexapro and HCTZ on hold.  Cont q 2 hour checks thru the day then check in AM  2.  HTN: do not restart HCTZ.  Confusion over which meds she's actually taking, greatly appreciate pharmacy med rec assistance. Now on coreg at low dose, BP mostly high- will inc dose  3.  Dementia: rec stop memantine for now  4.  Depression: rec avoid SSRIs     Kellon Chalk A    Labs: Basic Metabolic Panel:  Recent Labs Lab 02/08/17 0652 02/08/17 0834 02/08/17 1104  NA 113* 115* 115*  K 3.0* 3.2* 3.8  CL 80* 81* 80*  CO2 23 21* 26  GLUCOSE 134* 179* 152*  BUN 6 5* 6  CREATININE 0.51 0.58 0.58  CALCIUM 8.5* 8.6* 8.7*   Liver Function Tests:  Recent Labs Lab  02/07/17 1529  AST 59*  ALT 23  ALKPHOS 95  BILITOT 1.7*  PROT 7.1  ALBUMIN 3.9   No results for input(s): LIPASE, AMYLASE in the last 168 hours. No results for input(s): AMMONIA in the last 168 hours. CBC:  Recent Labs Lab 02/07/17 1529 02/08/17 0338  WBC 11.5* 11.2*  HGB 13.2 13.5  HCT 35.6* 36.5  MCV 79.5 79.5  PLT 276 268   Cardiac Enzymes: No results for input(s): CKTOTAL, CKMB, CKMBINDEX, TROPONINI in the last 168 hours. CBG:  Recent Labs Lab 02/07/17 1433  GLUCAP 174*    Iron Studies: No results for input(s): IRON, TIBC, TRANSFERRIN, FERRITIN in the last 72 hours. Studies/Results: No results found. Medications: Infusions: . sodium chloride (hypertonic) 30 mL/hr at 02/08/17 0600    Scheduled Medications: . carvedilol  3.125 mg Oral BID WC  . enoxaparin (LOVENOX) injection  40 mg Subcutaneous Q24H  . simvastatin  40 mg Oral  Daily  . sodium chloride flush  3 mL Intravenous Q12H    have reviewed scheduled and prn medications.  Physical Exam: General: alert in bed but repeatedly asking about medicine- family says she looks better but is not thinking any better yet Heart:RRR Lungs: mostly clear Abdomen: soft, non tender Extremities: no edema    02/08/2017,11:45 AM  LOS: 1 day

## 2017-02-08 NOTE — Evaluation (Signed)
Physical Therapy Evaluation Patient Details Name: [name] [name] MRN: [id number] DOB: [birth year] Today's Date: 02/08/2017   History of Present Illness  Pt admit with h/o recent diagnosis of dementia, and reported by fmaily tp have increased weakness and dizziness. Found to have hyponatremia .   Clinical Impression  Pt with very little weakness noted, may have some safety concerns with her early dementia however pt's son didn't voice these at all at this time. Would like to assess or have nursing walk with her one more time this visit to assess he walking ability once all the lines and tubes are dc's .Marland Kitchen For these seemed to through her focus and walking off at this time, however she still did well.      Follow Up Recommendations No PT follow up    Equipment Recommendations  None recommended by PT    Recommendations for Other Services       Precautions / Restrictions Precautions Precautions:  (family reports she has not fallen )      Mobility  Bed Mobility Overal bed mobility: Needs Assistance Bed Mobility: Supine to Sit;Sit to Supine     Supine to sit: Supervision Sit to supine: Supervision   General bed mobility comments: only due to the lines and tubes confusion her quite a bit  Transfers Overall transfer level: Needs assistance Equipment used: Rolling walker (2 wheeled) Transfers: Sit to/from Stand Sit to Stand: Min guard         General transfer comment: tried to use the RW at first however pt is not used to it and was carrying it . So after going to the toilet we just walked with no AD and she did better.  Ambulation/Gait Ambulation/Gait assistance: Min guard Ambulation Distance (Feet): 40 Feet Assistive device: Rolling walker (2 wheeled) Gait Pattern/deviations: WFL(Within Functional Limits) Gait velocity: quick pace.    General Gait Details: fast pace, did well, but did better without the RW. Had one LOB in bathroom, however recovered herself. She was  navigating the line and tubes and thye just got her off balance while standing up to try to wipe (which she stted she normally does not do)   Science writer    Modified Rankin (Stroke Patients Only)       Balance                                             Pertinent Vitals/Pain Pain Assessment: No/denies pain    Home Living Family/patient expects to be discharged to:: Private residence Living Arrangements: Alone Available Help at Discharge: Family (a lot of family near by and they check on he and call often ) Type of Home: House Home Access: Stairs to enter   [employer] of Steps: 1-2 Home Layout: One level Home Equipment: None      Prior Function Level of Independence: Independent         Comments: per patietns son, she doesn't use an AD , does evrything independent and they check on her.      Hand Dominance        Extremity/Trunk Assessment        Lower Extremity Assessment Lower Extremity Assessment: Overall WFL for tasks assessed       Communication   Communication: No difficulties  Cognition Arousal/Alertness: Awake/alert Behavior During  Therapy: WFL for tasks assessed/performed Overall Cognitive Status: Within Functional Limits for tasks assessed                                        General Comments      Exercises     Assessment/Plan    PT Assessment Patient needs continued PT services  PT Problem List Decreased activity tolerance;Decreased mobility       PT Treatment Interventions Gait training;Functional mobility training;Therapeutic activities;Therapeutic exercise;Patient/family education    PT Goals (Current goals can be found in the Care Plan section)  Acute Rehab PT Goals Patient Stated Goal: I feel better PT Goal Formulation: With patient Time For Goal Achievement: 03/22/17 Potential to Achieve Goals: Good    Frequency Min 3X/week   Barriers to  discharge        Co-evaluation               AM-PAC PT "6 Clicks" Daily Activity  Outcome Measure Difficulty turning over in bed (including adjusting bedclothes, sheets and blankets)?: A Little Difficulty moving from lying on back to sitting on the side of the bed? : A Little Difficulty sitting down on and standing up from a chair with arms (e.g., wheelchair, bedside commode, etc,.)?: A Little Help needed moving to and from a bed to chair (including a wheelchair)?: A Little Help needed walking in hospital room?: A Little Help needed climbing 3-5 steps with a railing? : A Little 6 Click Score: 18    End of Session Equipment Utilized During Treatment: Gait belt Activity Tolerance: Patient tolerated treatment well Patient left: in chair;with chair alarm set;with call bell/phone within reach;with family/visitor present Nurse Communication: Mobility status PT Visit Diagnosis: Unsteadiness on feet (R26.81)    Time: 2330-0762 PT Time Calculation (min) (ACUTE ONLY): 20 min   Charges:   PT Evaluation $PT Eval Low Complexity: 1 Procedure PT Treatments $Therapeutic Activity: 8-22 mins   PT G Codes:   PT G-Codes **NOT FOR INPATIENT CLASS** Functional Assessment Tool Used: AM-PAC 6 Clicks Basic Mobility   [name] [name], PT Pager: [phone] 02/08/2017   [name] [name], [name] [name] 02/08/2017, 4:44 PM

## 2017-02-08 NOTE — Progress Notes (Signed)
Dr. Hollie Salk made aware of low; but gradual increase in sodium level. Sodium at 115 and serum osmolality at 232 when reported. New order given to decrease rate of sodium chloride hypertonic 3% solution. Hale Bogus.

## 2017-02-09 ENCOUNTER — Inpatient Hospital Stay (HOSPITAL_COMMUNITY): Payer: PPO

## 2017-02-09 ENCOUNTER — Encounter (HOSPITAL_COMMUNITY): Payer: Self-pay

## 2017-02-09 DIAGNOSIS — E43 Unspecified severe protein-calorie malnutrition: Secondary | ICD-10-CM | POA: Insufficient documentation

## 2017-02-09 LAB — BASIC METABOLIC PANEL
ANION GAP: 9 (ref 5–15)
ANION GAP: 9 (ref 5–15)
Anion gap: 6 (ref 5–15)
BUN: <5 mg/dL — ABNORMAL LOW (ref 6–20)
BUN: <5 mg/dL — ABNORMAL LOW (ref 6–20)
BUN: 7 mg/dL (ref 6–20)
CALCIUM: 8.7 mg/dL — AB (ref 8.9–10.3)
CALCIUM: 9 mg/dL (ref 8.9–10.3)
CALCIUM: 9.5 mg/dL (ref 8.9–10.3)
CO2: 25 mmol/L (ref 22–32)
CO2: 24 mmol/L (ref 22–32)
CO2: 26 mmol/L (ref 22–32)
CREATININE: 0.57 mg/dL (ref 0.44–1.00)
Chloride: 91 mmol/L — ABNORMAL LOW (ref 101–111)
Chloride: 91 mmol/L — ABNORMAL LOW (ref 101–111)
Chloride: 86 mmol/L — ABNORMAL LOW (ref 101–111)
Creatinine, Ser: 0.54 mg/dL (ref 0.44–1.00)
Creatinine, Ser: 0.61 mg/dL (ref 0.44–1.00)
GLUCOSE: 123 mg/dL — AB (ref 65–99)
Glucose, Bld: 120 mg/dL — ABNORMAL HIGH (ref 65–99)
Glucose, Bld: 115 mg/dL — ABNORMAL HIGH (ref 65–99)
POTASSIUM: 4 mmol/L (ref 3.5–5.1)
Potassium: 3.9 mmol/L (ref 3.5–5.1)
Potassium: 3.9 mmol/L (ref 3.5–5.1)
SODIUM: 122 mmol/L — AB (ref 135–145)
Sodium: 124 mmol/L — ABNORMAL LOW (ref 135–145)
Sodium: 121 mmol/L — ABNORMAL LOW (ref 135–145)

## 2017-02-09 LAB — COMPREHENSIVE METABOLIC PANEL
ALBUMIN: 3.7 g/dL (ref 3.5–5.0)
ALT: 19 U/L (ref 14–54)
ANION GAP: 6 (ref 5–15)
AST: 32 U/L (ref 15–41)
Alkaline Phosphatase: 84 U/L (ref 38–126)
BUN: <5 mg/dL — ABNORMAL LOW (ref 6–20)
CHLORIDE: 91 mmol/L — AB (ref 101–111)
CO2: 25 mmol/L (ref 22–32)
Calcium: 8.7 mg/dL — ABNORMAL LOW (ref 8.9–10.3)
Creatinine, Ser: 0.56 mg/dL (ref 0.44–1.00)
GFR calc Af Amer: >60 mL/min (ref >60)
GFR calc non Af Amer: >60 mL/min (ref >60)
GLUCOSE: 121 mg/dL — AB (ref 65–99)
POTASSIUM: 3.9 mmol/L (ref 3.5–5.1)
SODIUM: 122 mmol/L — AB (ref 135–145)
TOTAL PROTEIN: 6.5 g/dL (ref 6.5–8.1)
Total Bilirubin: 1.1 mg/dL (ref 0.3–1.2)

## 2017-02-09 MED ORDER — IOPAMIDOL (ISOVUE-300) INJECTION 61%
INTRAVENOUS | Status: AC
  Filled 2017-02-09: qty 30

## 2017-02-09 MED ORDER — IOPAMIDOL (ISOVUE-300) INJECTION 61%
INTRAVENOUS | Status: AC
  Filled 2017-02-09: qty 100

## 2017-02-09 MED ORDER — QUETIAPINE FUMARATE 25 MG PO TABS
25.0000 mg | ORAL_TABLET | Freq: Every day | ORAL | Status: DC
  Administered 2017-02-09: 25 mg via ORAL
  Filled 2017-02-09: qty 1

## 2017-02-09 MED ORDER — IOPAMIDOL (ISOVUE-300) INJECTION 61%
30.0000 mL | Freq: Once | INTRAVENOUS | Status: DC | PRN
Start: 2017-02-09 — End: 2017-02-12

## 2017-02-09 MED ORDER — IOPAMIDOL (ISOVUE-300) INJECTION 61%
100.0000 mL | Freq: Once | INTRAVENOUS | Status: AC | PRN
  Administered 2017-02-09: 100 mL via INTRAVENOUS

## 2017-02-09 MED ORDER — ENSURE ENLIVE PO LIQD
237.0000 mL | Freq: Two times a day (BID) | ORAL | Status: DC
Start: 2017-02-09 — End: 2017-02-12
  Administered 2017-02-09 – 2017-02-12 (×3): 237 mL via ORAL

## 2017-02-09 MED ORDER — SODIUM CHLORIDE 1 G PO TABS
2.0000 g | ORAL_TABLET | Freq: Three times a day (TID) | ORAL | Status: DC
  Administered 2017-02-10 (×3): 2 g via ORAL
  Filled 2017-02-09 (×3): qty 2

## 2017-02-09 MED ORDER — BOOST / RESOURCE BREEZE PO LIQD
1.0000 | Freq: Two times a day (BID) | ORAL | Status: DC
  Administered 2017-02-09: 1 via ORAL
  Administered 2017-02-10: 237 mL via ORAL
  Administered 2017-02-10 – 2017-02-12 (×5): 1 via ORAL

## 2017-02-09 MED ORDER — ADULT MULTIVITAMIN W/MINERALS CH
1.0000 | ORAL_TABLET | Freq: Every day | ORAL | Status: DC
  Administered 2017-02-09 – 2017-02-12 (×4): 1 via ORAL
  Filled 2017-02-09 (×4): qty 1

## 2017-02-09 NOTE — Progress Notes (Signed)
Patient ID: [name] [name], female   DOB: 04-16-1935, 82 y.o.   MRN: [id number]  PROGRESS NOTE    [name] [name]  QAS:[id number] DOB: 1934-09-17 DOA: 02/07/2017 PCP: Stephens Shire, MD   Brief Narrative:  82 year female with history of recent diagnosis of dementia, hypertension, dyslipidemia, some sort of lung problem (patient and family members unaware of the diagnosis; although records indicate bronchiectasis) presented with weakness and dizziness and was found to have sodium of 110. She was admitted with hypertonic saline. Nephrology has evaluated the patient   Assessment & Plan:   Principal Problem:   Hyponatremia Active Problems:   Dementia   HTN (hypertension)    Acute hyponatremia with hypochloremia - Probably secondary to dehydration and very poor oral intake and diuretic use including hydrochlorothiazide. - Sodium level has significantly improved. Currently on 3% saline as per nephrology recommendations. Will follow further recommendations from nephrology.  - Hold hydrochlorothiazide. - Neuro checks every 4 hourly  Dehydration and very poor oral intake - Questionable cause. If problem persists, then we might have to proceed further imaging studies including CAT scan to rule out malignancy. - Dietary evaluation  Mild leukocytosis Probably reactive. Monitor off antibiotics. Repeat a.m. Labs  Hypertension Blood pressure on the higher side. Monitor blood pressure. Hold diuretics for now. Continue Coreg  Dyslipidemia Continue simvastatin  Dementia Monitor mental status. Fall precautions. Hold memantine for now. PT evaluation. Care management consult for home healthneeds. Patient remains intermittently confused probably secondary to delirium on top of dementia..  Mild transaminitis with elevated bilirubin Probably from starvation. Resolved   DVT prophylaxis:Lovenox  Code Status:DO NOT RESUSCITATE Family Communication: no family present at bedside    Disposition Plan:Home in 1-3 days  Consultants: Nephrology  Procedures: None  Antimicrobials: None  Subjective: Patient seen and examined at bedside. She is awake and answers some questions but slightly confused. Denies any new complaints  Objective: Vitals:   02/08/17 2303 02/09/17 0100 02/09/17 0200 02/09/17 0308  BP:      Pulse:      Resp:  18 (!) 22   Temp: 98 F (36.7 C)   97.6 F (36.4 C)  TempSrc: Oral   Oral  SpO2:      Weight:      Height:        Intake/Output Summary (Last 24 hours) at 02/09/17 0817 Last data filed at 02/08/17 2200  Gross per 24 hour  Intake              320 ml  Output              600 ml  Net             -280 ml   Filed Weights   02/07/17 1422 02/08/17 0502  Weight: 46.7 kg (103 lb) 47.7 kg (105 lb 2.6 oz)    Examination:  General exam: Appears calm and comfortable  Respiratory system: Bilateral decreased breath sound at bases Cardiovascular system: S1 & S2 heard, Rate controlled  Gastrointestinal system: Abdomen is nondistended, soft and nontender. Normal bowel sounds heard. Extremities: No cyanosis, clubbing, edema    Data Reviewed: I have personally reviewed following labs and imaging studies  CBC:  Recent Labs Lab 02/07/17 1529 02/08/17 0338  WBC 11.5* 11.2*  HGB 13.2 13.5  HCT 35.6* 36.5  MCV 79.5 79.5  PLT 276 798   Basic Metabolic Panel:  Recent Labs Lab 02/08/17 0834 02/08/17 1104 02/08/17 1619 02/09/17 0419 02/09/17 0655  NA  115* 115* 116* 122*  122* 124*  K 3.2* 3.8 4.1 3.9  3.9 4.0  CL 81* 80* 83* 91*  91* 91*  CO2 21* 26 25 25  25 24   GLUCOSE 179* 152* 138* 121*  120* 123*  BUN 5* 6 6 <5*  <5* <5*  CREATININE 0.58 0.58 0.57 0.56  0.57 0.54  CALCIUM 8.6* 8.7* 8.9 8.7*  8.7* 9.0  PHOS  --   --  1.6*  --   --    GFR: Estimated Creatinine Clearance: 42.2 mL/min (by C-G formula based on SCr of 0.54 mg/dL). Liver Function Tests:  Recent Labs Lab 02/07/17 1529 02/08/17 1619  02/09/17 0419  AST 59*  --  32  ALT 23  --  19  ALKPHOS 95  --  84  BILITOT 1.7*  --  1.1  PROT 7.1  --  6.5  ALBUMIN 3.9 3.7 3.7   No results for input(s): LIPASE, AMYLASE in the last 168 hours. No results for input(s): AMMONIA in the last 168 hours. Coagulation Profile: No results for input(s): INR, PROTIME in the last 168 hours. Cardiac Enzymes: No results for input(s): CKTOTAL, CKMB, CKMBINDEX, TROPONINI in the last 168 hours. BNP (last 3 results) No results for input(s): PROBNP in the last 8760 hours. HbA1C: No results for input(s): HGBA1C in the last 72 hours. CBG:  Recent Labs Lab 02/07/17 1433  GLUCAP 174*   Lipid Profile: No results for input(s): CHOL, HDL, LDLCALC, TRIG, CHOLHDL, LDLDIRECT in the last 72 hours. Thyroid Function Tests:  Recent Labs  02/08/17 0338  TSH 1.053   Anemia Panel: No results for input(s): VITAMINB12, FOLATE, FERRITIN, TIBC, IRON, RETICCTPCT in the last 72 hours. Sepsis Labs: No results for input(s): PROCALCITON, LATICACIDVEN in the last 168 hours.  Recent Results (from the past 240 hour(s))  MRSA PCR Screening     Status: None   Collection Time: 02/07/17  8:24 PM  Result Value Ref Range Status   MRSA by PCR NEGATIVE NEGATIVE Final    Comment:        The GeneXpert MRSA Assay (FDA approved for NASAL specimens only), is one component of a comprehensive MRSA colonization surveillance program. It is not intended to diagnose MRSA infection nor to guide or monitor treatment for MRSA infections.          Radiology Studies: No results found.      Scheduled Meds: . carvedilol  6.25 mg Oral BID WC  . enoxaparin (LOVENOX) injection  40 mg Subcutaneous Q24H  . simvastatin  40 mg Oral Daily  . sodium chloride flush  3 mL Intravenous Q12H   Continuous Infusions:   LOS: 2 days        Aline August, MD [facility] Pager [phone]  If 7PM-7AM, please contact night-coverage [email or web address] Password  [facility] 02/09/2017, 8:17 AM

## 2017-02-09 NOTE — Progress Notes (Signed)
Subjective:  Serum Na up to mid 120's and 3% saline stopped earlier this am.  Na dropping again down to 121.  Pt remains confused.   Objective Vital signs in last 24 hours: Vitals:   02/09/17 1200 02/09/17 1400 02/09/17 1600 02/09/17 1650  BP: (!) 148/73 (!) 145/74 (!) 189/78 (!) 175/73  Pulse:    76  Resp: (!) 22 18 20 20   Temp: 97.7 F (36.5 C)   97.4 F (36.3 C)  TempSrc: Oral   Oral  SpO2: 98%   93%  Weight:      Height:       Weight change:   Intake/Output Summary (Last 24 hours) at 02/09/17 1814 Last data filed at 02/09/17 1351  Gross per 24 hour  Intake              180 ml  Output              108 ml  Net               72 ml    Assessment/Plan: 1. Hyponatremia - hypovolemic to euvolemic but given high BP do not think is dry.  Na was very low and pt confused.  No baseline labs for review.  Underlying dementia; however family reports certainly not at baseline.  Suspect mixed picture of SIADH (Lexapro, HCTZ), ? bronchiectasis + tea/toast.  her urine osm is low so she is trying to dilute urine- may not be able to dilute more.  SP 3% saline, Na improved and 3% stopped today. Will start NaCl tabs and fluid restriction.  Lexapro and HCTZ on hold.  2.  HTN: do not restart HCTZ.  Confusion over which meds she's actually taking, greatly appreciate pharmacy med rec assistance. Now on coreg at low dose, BP mostly high- will inc dose  3.  Dementia: rec stop memantine for now  4.  Depression: rec avoid SSRIs   Kelly Splinter MD [facility] pgr [phone]   02/09/2017, 6:16 PM    Labs: Basic Metabolic Panel:  Recent Labs Lab 02/08/17 1619 02/09/17 0419 02/09/17 0655 02/09/17 1650  NA 116* 122*  122* 124* 121*  K 4.1 3.9  3.9 4.0 3.9  CL 83* 91*  91* 91* 86*  CO2 25 25  25 24 26   GLUCOSE 138* 121*  120* 123* 115*  BUN 6 <5*  <5* <5* 7  CREATININE 0.57 0.56  0.57 0.54 0.61  CALCIUM 8.9 8.7*  8.7* 9.0 9.5  PHOS 1.6*  --   --   --    Liver  Function Tests:  Recent Labs Lab 02/07/17 1529 02/08/17 1619 02/09/17 0419  AST 59*  --  32  ALT 23  --  19  ALKPHOS 95  --  84  BILITOT 1.7*  --  1.1  PROT 7.1  --  6.5  ALBUMIN 3.9 3.7 3.7   No results for input(s): LIPASE, AMYLASE in the last 168 hours. No results for input(s): AMMONIA in the last 168 hours. CBC:  Recent Labs Lab 02/07/17 1529 02/08/17 0338  WBC 11.5* 11.2*  HGB 13.2 13.5  HCT 35.6* 36.5  MCV 79.5 79.5  PLT 276 268   Cardiac Enzymes: No results for input(s): CKTOTAL, CKMB, CKMBINDEX, TROPONINI in the last 168 hours. CBG:  Recent Labs Lab 02/07/17 1433  GLUCAP 174*    Iron Studies: No results for input(s): IRON, TIBC, TRANSFERRIN, FERRITIN in the last 72 hours. Studies/Results: No results found. Medications: Infusions:  Scheduled Medications: . carvedilol  6.25 mg Oral BID WC  . enoxaparin (LOVENOX) injection  40 mg Subcutaneous Q24H  . feeding supplement  1 Container Oral BID BM  . feeding supplement (ENSURE ENLIVE)  237 mL Oral BID BM  . iopamidol      . iopamidol      . multivitamin with minerals  1 tablet Oral Daily  . QUEtiapine  25 mg Oral QHS  . simvastatin  40 mg Oral Daily  . sodium chloride flush  3 mL Intravenous Q12H    have reviewed scheduled and prn medications.  Physical Exam: General: alert in bed but repeatedly asking about medicine- family says she looks better but is not thinking any better yet Heart:RRR Lungs: mostly clear Abdomen: soft, non tender Extremities: no edema    02/09/2017,6:14 PM  LOS: 2 days

## 2017-02-09 NOTE — Clinical Social Work Note (Signed)
Clinical Social Work Assessment  Patient Details  Name: [name] [name] MRN: [id number] Date of Birth: Mar 12, 1935  Date of referral:  02/09/17               Reason for consult:  Family Concerns, Discharge Planning                Permission sought to share information with:    Permission granted to share information::     Name::        Agency::     Relationship::     Contact Information:     Housing/Transportation Living arrangements for the past 2 months:  Single Family Home Source of Information:  Adult Children Patient Interpreter Needed:  None Criminal Activity/Legal Involvement Pertinent to Current Situation/Hospitalization:  No - Comment as needed Significant Relationships:  Adult Children Lives with:  Self Do you feel safe going back to the place where you live?   (24/7 supervision needed) Need for family participation in patient care:  Yes (Comment)  Care giving concerns:  Pt is unable to manage safely on her own following hospital d/c.   Social Worker assessment / plan:  Pt hospitalized on 02/07/17 from home with Hyponatremia. Pt has a hx of dementia and has been living alone with family support. CSW met with pt's daughter to assist with d/c planning. Pt is unable to participate in dc planning due to dementia. Daughter reports that her mother would most likely prefer to remain at home . Pvt pay home care list provided. CSW also provided ALF / Memory care facility list in order for daughter to investigate this option. CSW will assist with FL2 / initiate placement search if ALF / Memory Care is chosen. Daughter understands that if ALF / Memory care option is chosen, but not in place at the time of dc, pt may need to be placed from home.   Employment status:  Retired Nurse, adult PT Recommendations:  No Follow Up Information / Referral to community resources:  Other (Comment Required) (ALF/memory care / pvt pay assistance)  Patient/Family's Response  to care:  Disposition to be determined.  Patient/Family's Understanding of and Emotional Response to Diagnosis, Current Treatment, and Prognosis: Daughter is aware of pt's medical status. " My mother's doctor told me she can't be by herself any more. I'm no  sure what to do next." Support / reassurance and ongoing assistance will be provided.  Emotional Assessment Appearance:  Appears stated age Attitude/Demeanor/Rapport:  Inconsistent Affect (typically observed):  Anxious Orientation:  Oriented to Self, Oriented to Place Alcohol / Substance use:  Not Applicable Psych involvement (Current and /or in the community):  No (Comment)  Discharge Needs  Concerns to be addressed:  Discharge Planning Concerns Readmission within the last 30 days:  No Current discharge risk:  None Barriers to Discharge:  No Barriers Identified   Luretha Rued, [place] 02/09/2017, 3:07 PM

## 2017-02-09 NOTE — Progress Notes (Signed)
Initial Nutrition Assessment  DOCUMENTATION CODES:   Severe malnutrition in context of chronic illness, Severe malnutrition in context of acute illness/injury, Underweight  INTERVENTION:  - Will order Ensure Enlive BID, each supplement provides 350 kcal and 20 grams of protein - Will order Boost Breeze BID to be used to give medications, each supplement provides 250 kcal and 9 grams of protein - Will order daily multivitamin with minerals.  - Continue to encourage PO intakes of meals and supplements.   NUTRITION DIAGNOSIS:   Malnutrition (severe) related to chronic illness, acute illness (dementia, bronchiectasis; recent flu) as evidenced by severe depletion of muscle mass, severe depletion of body fat.  GOAL:   Patient will meet greater than or equal to 90% of their needs  MONITOR:   PO intake, Supplement acceptance, Weight trends, Labs  REASON FOR ASSESSMENT:   Consult Poor PO  ASSESSMENT:   82 y.o. female with medical history significant of recent diagnosis of dementia, hypertension, dyslipidemia, some sort of lung problem (patient and family members unaware of the diagnosis; although records indicate bronchiectasis) presented with worsening weakness and dizziness. Patient is a poor historian and and is confused. Reliability of the history is poor. Family members present at bedside don't live the patient. As per the family members, patient was recently diagnosed with dementia few weeks ago and was started on a medication. Patient's appetite has been very poor over the last 2-3 weeks. Patient has become progressively very weak and today she was very weak and dizzy so she was brought to the emergency room.  Pt seen for consult. BMI indicates underweight status. No intakes documented since admission. No family/visitors present at time of RD visit. Pt with hx of dementia; at some points she was appropriate in conversation and at others confusion was apparent. Sitter reports that she  provided encouragement during breakfast this AM and pt consumed a blueberry muffin and a cup of coffee. Pt reports that she recently had the flu which caused her to lose her appetite. She reports that she is beginning to feel better and appetite is slowly improving. Per H&P, outlined above, family reported very poor appetite and intakes x2-3 weeks PTA. Pt denies abdominal pain or nausea after breakfast this AM.   Physical assessment shows mild/moderate and severe muscle and mild/moderate and severe fat wasting throughout upper and lower body. Per chart review, pt weighed 109 lbs at [facility] on 6/6. Pt appropriately states that she believes she has lost 4 lbs recently. This is 3.7% body weight in the past 19 days which is significant for time frame.  Medications reviewed. Labs reviewed; Na: 124 mmol/L, Cl: 91 mmol/L, Phos: 1.6 mg/dL, BUN: <5 mg/dL.   Diet Order:  Diet regular Room service appropriate? Yes; Fluid consistency: Thin  Skin:  Reviewed, no issues  Last BM:  PTA (unknown)  Height:   Ht Readings from Last 1 Encounters:  02/07/17 5\' 3"  (1.6 m)    Weight:   Wt Readings from Last 1 Encounters:  02/08/17 105 lb 2.6 oz (47.7 kg)    Ideal Body Weight:  52.27 kg  BMI:  Body mass index is 18.63 kg/m.  Estimated Nutritional Needs:   Kcal:  1430-1620 (30-34 kcal/kg)  Protein:  48-57 grams (1-1.2 grams/kg)  Fluid:  1.5 L/day  EDUCATION NEEDS:   No education needs identified at this time    Jarome Matin, MS, RD, LDN, CNSC Inpatient Clinical Dietitian Pager # [phone] After hours/weekend pager # [phone]

## 2017-02-10 DIAGNOSIS — F320 Major depressive disorder, single episode, mild: Secondary | ICD-10-CM

## 2017-02-10 DIAGNOSIS — R634 Abnormal weight loss: Secondary | ICD-10-CM

## 2017-02-10 DIAGNOSIS — F0391 Unspecified dementia with behavioral disturbance: Secondary | ICD-10-CM

## 2017-02-10 DIAGNOSIS — I10 Essential (primary) hypertension: Secondary | ICD-10-CM

## 2017-02-10 DIAGNOSIS — F0390 Unspecified dementia without behavioral disturbance: Secondary | ICD-10-CM

## 2017-02-10 DIAGNOSIS — E43 Unspecified severe protein-calorie malnutrition: Secondary | ICD-10-CM

## 2017-02-10 LAB — BASIC METABOLIC PANEL
ANION GAP: 8 (ref 5–15)
BUN: 6 mg/dL (ref 6–20)
CALCIUM: 9 mg/dL (ref 8.9–10.3)
CO2: 28 mmol/L (ref 22–32)
Chloride: 88 mmol/L — ABNORMAL LOW (ref 101–111)
Creatinine, Ser: 0.64 mg/dL (ref 0.44–1.00)
GFR calc non Af Amer: >60 mL/min (ref >60)
Glucose, Bld: 94 mg/dL (ref 65–99)
Potassium: 3.6 mmol/L (ref 3.5–5.1)
Sodium: 124 mmol/L — ABNORMAL LOW (ref 135–145)

## 2017-02-10 LAB — MAGNESIUM: Magnesium: 1.7 mg/dL (ref 1.7–2.4)

## 2017-02-10 MED ORDER — POLYETHYLENE GLYCOL 3350 17 G PO PACK: 17.0000 g | PACK | Freq: Every day | ORAL | Status: DC

## 2017-02-10 MED ORDER — QUETIAPINE FUMARATE 25 MG PO TABS
12.5000 mg | ORAL_TABLET | Freq: Every day | ORAL | Status: DC
  Administered 2017-02-10 – 2017-02-11 (×2): 12.5 mg via ORAL
  Filled 2017-02-10 (×2): qty 1

## 2017-02-10 MED ORDER — IPRATROPIUM BROMIDE 0.02 % IN SOLN
0.5000 mg | Freq: Once | RESPIRATORY_TRACT | Status: DC
  Filled 2017-02-10: qty 2.5

## 2017-02-10 MED ORDER — CARVEDILOL 12.5 MG PO TABS
12.5000 mg | ORAL_TABLET | Freq: Two times a day (BID) | ORAL | Status: DC
  Administered 2017-02-11 – 2017-02-12 (×3): 12.5 mg via ORAL
  Filled 2017-02-10 (×4): qty 1

## 2017-02-10 MED ORDER — DONEPEZIL HCL 10 MG PO TABS
5.0000 mg | ORAL_TABLET | Freq: Every day | ORAL | Status: DC
  Administered 2017-02-10 – 2017-02-11 (×2): 5 mg via ORAL
  Filled 2017-02-10 (×2): qty 1

## 2017-02-10 MED ORDER — FLUOXETINE HCL 10 MG PO CAPS
10.0000 mg | ORAL_CAPSULE | Freq: Every day | ORAL | Status: DC
  Filled 2017-02-10: qty 1

## 2017-02-10 MED ORDER — ALBUTEROL SULFATE (2.5 MG/3ML) 0.083% IN NEBU
5.0000 mg | INHALATION_SOLUTION | Freq: Once | RESPIRATORY_TRACT | Status: DC
  Filled 2017-02-10: qty 6

## 2017-02-10 NOTE — Consult Note (Addendum)
[facility] Consult   Reason for Consult:  Dementia with depression Referring Physician:  Dr. Starla Link Patient Identification: [name] [name] MRN:  [id number] Principal Diagnosis: Hyponatremia Diagnosis:   Patient Active Problem List   Diagnosis Date Noted  . Protein-calorie malnutrition, severe [E43] 02/09/2017  . Hyponatremia [E87.1] 02/07/2017  . Dementia [F03.90] 02/07/2017  . HTN (hypertension) [I10] 02/07/2017  . Diarrhea [R19.7] 12/03/2015  . Mycobacterium avium infection ([place]) [A31.0] 09/04/2015  . Hemoptysis [R04.2] 04/16/2015  . Bronchiectasis without acute exacerbation ([place]) [J47.9] 03/21/2011  . Chronic cough [R05] 02/18/2011  . Rhinitis [J31.0] 02/18/2011  . GERD (gastroesophageal reflux disease) [K21.9] 02/18/2011    Total Time spent with patient: 1 hour  Subjective:   [name] [name] is a 82 y.o. female patient admitted with hyponatremia with weakness.  HPI:  [name] [name] is a 82 y.o. female, admitted with the generalized weakness, tired, dizziness and unable to walk according to family members who brought her to the hospital. Patient daughter and son has been at bedside who provided most of the information for this evaluation. Reportedly recent has been diagnosed with mild dementia about 2 months ago but not given any medication. Patient is also being depressed since she has a lot of family deaths and and has been ruminated about dose losses. Patient has been awake, alert and oriented to her name, name of the hospital but does not know why she has been placed in the hospital. Reportedly patient lives by herself and also able to take care of herself including driving and shopping. Patient family is willing to start antidepressant medication Prozac for depression and add Aricept for mild dementia when her hyponatremia has been completely treated.   Past Psychiatric History: She has no previous history of acute psychiatric hospitalization.  Risk to  Self: Is patient at risk for suicide?: No Risk to Others:   Prior Inpatient Therapy:   Prior Outpatient Therapy:    Past Medical History:  Past Medical History:  Diagnosis Date  . Heart murmur   . Hypercholesteremia   . Hypertension     Past Surgical History:  Procedure Laterality Date  . VIDEO BRONCHOSCOPY Bilateral 04/16/2015   Procedure: VIDEO BRONCHOSCOPY WITHOUT FLUORO;  Surgeon: Collene Gobble, MD;  Location: [facility] [facility];  Service: Cardiopulmonary;  Laterality: Bilateral;   Family History:  Family History  Problem Relation Age of Onset  . Emphysema Father   . Brain cancer Brother    Family Psychiatric  History: History significant for depression in her both daughter onset brother.  Social History:  History  Alcohol Use No     History  Drug Use No    Social History   Social History  . Marital status: Widowed    Spouse name: N/A  . Number of children: N/A  . Years of education: N/A   Occupational History  . Network engineer     retired   Social History Main Topics  . Smoking status: Never Smoker  . Smokeless tobacco: Never Used  . Alcohol use No  . Drug use: No  . Sexual activity: No   Other Topics Concern  . None   Social History Narrative  . None   Additional Social History:    Allergies:  No Known Allergies  Labs:  Results for orders placed or performed during the hospital encounter of 02/07/17 (from the past 48 hour(s))  Renal function panel     Status: Abnormal   Collection Time: 02/08/17  4:19 PM  Result  Value Ref Range   Sodium 116 (LL) 135 - 145 mmol/L    Comment: CRITICAL RESULT CALLED TO, READ BACK BY AND VERIFIED WITH: Hale Bogus [id number] @ 1700 BY J SCOTTON    Potassium 4.1 3.5 - 5.1 mmol/L   Chloride 83 (L) 101 - 111 mmol/L   CO2 25 22 - 32 mmol/L   Glucose, Bld 138 (H) 65 - 99 mg/dL   BUN 6 6 - 20 mg/dL   Creatinine, Ser 0.57 0.44 - 1.00 mg/dL   Calcium 8.9 8.9 - 10.3 mg/dL   Phosphorus 1.6 (L) 2.5 - 4.6 mg/dL   Albumin 3.7  3.5 - 5.0 g/dL   GFR calc non Af Amer >60 >60 mL/min   GFR calc Af Amer >60 >60 mL/min    Comment: (NOTE) The eGFR has been calculated using the CKD EPI equation. This calculation has not been validated in all clinical situations. eGFR's persistently <60 mL/min signify possible Chronic Kidney Disease.    Anion gap 8 5 - 15  Basic metabolic panel     Status: Abnormal   Collection Time: 02/09/17  4:19 AM  Result Value Ref Range   Sodium 122 (L) 135 - 145 mmol/L    Comment: DELTA CHECK NOTED REPEATED TO VERIFY    Potassium 3.9 3.5 - 5.1 mmol/L   Chloride 91 (L) 101 - 111 mmol/L   CO2 25 22 - 32 mmol/L   Glucose, Bld 120 (H) 65 - 99 mg/dL   BUN <5 (L) 6 - 20 mg/dL   Creatinine, Ser 0.57 0.44 - 1.00 mg/dL   Calcium 8.7 (L) 8.9 - 10.3 mg/dL   GFR calc non Af Amer >60 >60 mL/min   GFR calc Af Amer >60 >60 mL/min    Comment: (NOTE) The eGFR has been calculated using the CKD EPI equation. This calculation has not been validated in all clinical situations. eGFR's persistently <60 mL/min signify possible Chronic Kidney Disease.    Anion gap 6 5 - 15  Comprehensive metabolic panel     Status: Abnormal   Collection Time: 02/09/17  4:19 AM  Result Value Ref Range   Sodium 122 (L) 135 - 145 mmol/L   Potassium 3.9 3.5 - 5.1 mmol/L   Chloride 91 (L) 101 - 111 mmol/L   CO2 25 22 - 32 mmol/L   Glucose, Bld 121 (H) 65 - 99 mg/dL   BUN <5 (L) 6 - 20 mg/dL   Creatinine, Ser 0.56 0.44 - 1.00 mg/dL   Calcium 8.7 (L) 8.9 - 10.3 mg/dL   Total Protein 6.5 6.5 - 8.1 g/dL   Albumin 3.7 3.5 - 5.0 g/dL   AST 32 15 - 41 U/L   ALT 19 14 - 54 U/L   Alkaline Phosphatase 84 38 - 126 U/L   Total Bilirubin 1.1 0.3 - 1.2 mg/dL   GFR calc non Af Amer >60 >60 mL/min   GFR calc Af Amer >60 >60 mL/min    Comment: (NOTE) The eGFR has been calculated using the CKD EPI equation. This calculation has not been validated in all clinical situations. eGFR's persistently <60 mL/min signify possible Chronic  Kidney Disease.    Anion gap 6 5 - 15  Basic metabolic panel     Status: Abnormal   Collection Time: 02/09/17  6:55 AM  Result Value Ref Range   Sodium 124 (L) 135 - 145 mmol/L   Potassium 4.0 3.5 - 5.1 mmol/L   Chloride 91 (L) 101 - 111 mmol/L  CO2 24 22 - 32 mmol/L   Glucose, Bld 123 (H) 65 - 99 mg/dL   BUN <5 (L) 6 - 20 mg/dL   Creatinine, Ser 0.54 0.44 - 1.00 mg/dL   Calcium 9.0 8.9 - 10.3 mg/dL   GFR calc non Af Amer >60 >60 mL/min   GFR calc Af Amer >60 >60 mL/min    Comment: (NOTE) The eGFR has been calculated using the CKD EPI equation. This calculation has not been validated in all clinical situations. eGFR's persistently <60 mL/min signify possible Chronic Kidney Disease.    Anion gap 9 5 - 15  Basic metabolic panel     Status: Abnormal   Collection Time: 02/09/17  4:50 PM  Result Value Ref Range   Sodium 121 (L) 135 - 145 mmol/L   Potassium 3.9 3.5 - 5.1 mmol/L   Chloride 86 (L) 101 - 111 mmol/L   CO2 26 22 - 32 mmol/L   Glucose, Bld 115 (H) 65 - 99 mg/dL   BUN 7 6 - 20 mg/dL   Creatinine, Ser 0.61 0.44 - 1.00 mg/dL   Calcium 9.5 8.9 - 10.3 mg/dL   GFR calc non Af Amer >60 >60 mL/min   GFR calc Af Amer >60 >60 mL/min    Comment: (NOTE) The eGFR has been calculated using the CKD EPI equation. This calculation has not been validated in all clinical situations. eGFR's persistently <60 mL/min signify possible Chronic Kidney Disease.    Anion gap 9 5 - 15  Basic metabolic panel     Status: Abnormal   Collection Time: 02/10/17  7:09 AM  Result Value Ref Range   Sodium 124 (L) 135 - 145 mmol/L   Potassium 3.6 3.5 - 5.1 mmol/L   Chloride 88 (L) 101 - 111 mmol/L   CO2 28 22 - 32 mmol/L   Glucose, Bld 94 65 - 99 mg/dL   BUN 6 6 - 20 mg/dL   Creatinine, Ser 0.64 0.44 - 1.00 mg/dL   Calcium 9.0 8.9 - 10.3 mg/dL   GFR calc non Af Amer >60 >60 mL/min   GFR calc Af Amer >60 >60 mL/min    Comment: (NOTE) The eGFR has been calculated using the CKD EPI  equation. This calculation has not been validated in all clinical situations. eGFR's persistently <60 mL/min signify possible Chronic Kidney Disease.    Anion gap 8 5 - 15  Magnesium     Status: None   Collection Time: 02/10/17  7:09 AM  Result Value Ref Range   Magnesium 1.7 1.7 - 2.4 mg/dL    Current Facility-Administered Medications  Medication Dose Route Frequency Provider Last Rate Last Dose  . acetaminophen (TYLENOL) tablet 500 mg  500 mg Oral Q6H PRN Alekh, Kshitiz, MD      . albuterol (PROVENTIL) (2.5 MG/3ML) 0.083% nebulizer solution 3 mL  3 mL Inhalation Q6H PRN Alekh, Kshitiz, MD      . carvedilol (COREG) tablet 12.5 mg  12.5 mg Oral BID WC Alekh, Kshitiz, MD      . enoxaparin (LOVENOX) injection 40 mg  40 mg Subcutaneous Q24H Starla Link, Kshitiz, MD   40 mg at 02/09/17 2112  . feeding supplement (BOOST / RESOURCE BREEZE) liquid 1 Container  1 Container Oral BID BM Aline August, MD   1 Container at 02/10/17 0830  . feeding supplement (ENSURE ENLIVE) (ENSURE ENLIVE) liquid 237 mL  237 mL Oral BID BM Alekh, Kshitiz, MD   237 mL at 02/10/17 1026  . iopamidol (  ISOVUE-300) 61 % injection 30 mL  30 mL Oral Once PRN Aline August, MD      . multivitamin with minerals tablet 1 tablet  1 tablet Oral Daily Aline August, MD   1 tablet at 02/10/17 1026  . ondansetron (ZOFRAN) tablet 4 mg  4 mg Oral Q6H PRN Aline August, MD       Or  . ondansetron (ZOFRAN) injection 4 mg  4 mg Intravenous Q6H PRN Aline August, MD   4 mg at 02/08/17 6503  . QUEtiapine (SEROQUEL) tablet 12.5 mg  12.5 mg Oral QHS Alekh, Kshitiz, MD      . simvastatin (ZOCOR) tablet 40 mg  40 mg Oral Daily Starla Link, Kshitiz, MD   40 mg at 02/10/17 1026  . sodium chloride flush (NS) 0.9 % injection 3 mL  3 mL Intravenous Q12H Aline August, MD   3 mL at 02/09/17 0931  . sodium chloride tablet 2 g  2 g Oral TID WC Roney Jaffe, MD   2 g at 02/10/17 0829    Musculoskeletal: Strength & Muscle Tone: decreased Gait &  Station: unable to stand Patient leans: N/A  Psychiatric Specialty Exam: Physical Exam as per history and physical   ROS  No Fever-chills, No Headache, No changes with Vision or hearing, reports vertigo No problems swallowing food or Liquids, No Chest pain, Cough or Shortness of Breath, No Abdominal pain, No Nausea or Vommitting, Bowel movements are regular, No Blood in stool or Urine, No dysuria, No new skin rashes or bruises, No new joints pains-aches,  No new weakness, tingling, numbness in any extremity, No recent weight gain or loss, No polyuria, polydypsia or polyphagia,   A full 10 point Review of Systems was done, except as stated above, all other Review of Systems were negative.  Blood pressure 139/80, pulse 84, temperature 98.2 F (36.8 C), temperature source Oral, resp. rate 16, height _0  (1.6 m), weight 47.7 kg (105 lb 2.6 oz), SpO2 100 %.Body mass index is 18.63 kg/m.  General Appearance: Guarded  Eye Contact:  Fair  Speech:  Blocked and Slow  Volume:  Decreased  Mood:  Depressed  Affect:  Constricted and Depressed  Thought Process:  Coherent and Goal Directed  Orientation:  Full (Time, Place, and Person)  Thought Content:  Logical  Suicidal Thoughts:  No  Homicidal Thoughts:  No  Memory:  Immediate;   Fair Recent;   Poor  Judgement:  Fair  Insight:  Shallow  Psychomotor Activity:  Decreased  Concentration:  Concentration: Fair and Attention Span: Fair  Recall:  [employer] of Knowledge:  Good  Language:  Fair  Akathisia:  Negative  Handed:  Right  AIMS (if indicated):     Assets:  Communication Skills Desire for Improvement Financial Resources/Insurance Housing Leisure Time Resilience Social Support Transportation  ADL's:  Impaired  Cognition:  Impaired,  Mild  Sleep:        Treatment Plan Summary: Daily contact with patient to assess and evaluate symptoms and progress in treatment and Medication management   Patient has no safety  concerns Patient will start Prozac 10 mg starting tomorrow for depression and Aricept 5 mg daily for mild dementia Patient case discussed with the patient daughter, son and CSW. Appreciate psychiatric consultation and follow up as clinically required Please contact [phone] or [phone] if needs further assistance  Disposition: This patient will be referred to the outpatient medication management when medically stable and discharged from the hospital. No  evidence of imminent risk to self or others at present.   Supportive therapy provided about ongoing stressors.  Ambrose Finland, MD 02/10/2017 11:34 AM

## 2017-02-10 NOTE — Progress Notes (Signed)
Subjective:  Na up to 124 this am.    Objective Vital signs in last 24 hours: Vitals:   02/09/17 1650 02/09/17 2051 02/10/17 0632 02/10/17 1350  BP: (!) 175/73 (!) 150/71 139/80 (!) 102/44  Pulse: 76 77 84 75  Resp: 20 18 16 18   Temp: 97.4 F (36.3 C) 97.6 F (36.4 C) 98.2 F (36.8 C) 98.4 F (36.9 C)  TempSrc: Oral Oral Oral Oral  SpO2: 93% 100% 100% 98%  Weight:      Height:       Weight change:   Intake/Output Summary (Last 24 hours) at 02/10/17 1652 Last data filed at 02/10/17 1438  Gross per 24 hour  Intake              395 ml  Output                0 ml  Net              395 ml   Physical: General: alert in bed but repeatedly asking about medicine- family says she looks better but is not thinking any better yet Heart:RRR Lungs: mostly clear Abdomen: soft, non tender Extremities: no edema  Assessment/Plan: 1. Hyponatremia - hypovolemic to euvolemic but given high BP do not think is dry.  Na was very low and pt confused.  No baseline labs for review.  Underlying dementia; however family reports certainly not at baseline. Suspect mixed picture of SIADH (Lexapro, HCTZ).  SP 3% saline, Na improved and 3% stopped yest and started on 1000 cc fluid restriction and salt tabs. Na up today at 124. Will continue present Rx w/ NaCl and fluid restriction, see where we are tomorrow.  Lexapro and HCTZ on hold.  2.  HTN: do not restart HCTZ. Started on Coreg bid.   3.  Dementia: rec stop memantine for now 4.  Depression: rec avoid SSRIs    Kelly Splinter MD [facility] pgr [phone]   02/10/2017, 4:52 PM    Labs: Basic Metabolic Panel:  Recent Labs Lab 02/08/17 1619  02/09/17 0655 02/09/17 1650 02/10/17 0709  NA 116*  < > 124* 121* 124*  K 4.1  < > 4.0 3.9 3.6  CL 83*  < > 91* 86* 88*  CO2 25  < > 24 26 28   GLUCOSE 138*  < > 123* 115* 94  BUN 6  < > <5* 7 6  CREATININE 0.57  < > 0.54 0.61 0.64  CALCIUM 8.9  < > 9.0 9.5 9.0  PHOS 1.6*  --   --    --   --   < > = values in this interval not displayed. Liver Function Tests:  Recent Labs Lab 02/07/17 1529 02/08/17 1619 02/09/17 0419  AST 59*  --  32  ALT 23  --  19  ALKPHOS 95  --  84  BILITOT 1.7*  --  1.1  PROT 7.1  --  6.5  ALBUMIN 3.9 3.7 3.7   No results for input(s): LIPASE, AMYLASE in the last 168 hours. No results for input(s): AMMONIA in the last 168 hours. CBC:  Recent Labs Lab 02/07/17 1529 02/08/17 0338  WBC 11.5* 11.2*  HGB 13.2 13.5  HCT 35.6* 36.5  MCV 79.5 79.5  PLT 276 268    Scheduled Medications: . carvedilol  12.5 mg Oral BID WC  . donepezil  5 mg Oral QHS  . enoxaparin (LOVENOX) injection  40 mg Subcutaneous Q24H  . feeding  supplement  1 Container Oral BID BM  . feeding supplement (ENSURE ENLIVE)  237 mL Oral BID BM  . [START ON 02/11/2017] FLUoxetine  10 mg Oral Daily  . multivitamin with minerals  1 tablet Oral Daily  . QUEtiapine  12.5 mg Oral QHS  . simvastatin  40 mg Oral Daily  . sodium chloride flush  3 mL Intravenous Q12H  . sodium chloride  2 g Oral TID WC    have reviewed scheduled and prn medications.     02/10/2017,4:52 PM  LOS: 3 days

## 2017-02-10 NOTE — Progress Notes (Signed)
Patient ID: [name] [name], female   DOB: June 16, 1935, 82 y.o.   MRN: [id number]  PROGRESS NOTE    Sibbie Flammia Massiah  EZM:[id number] DOB: [birth year] DOA: 02/07/2017 PCP: Heywood Bene, PA-C   Brief Narrative:  82 year female with history of recent diagnosis of dementia, hypertension, dyslipidemia, some sort of lung problem (patient and family members unaware of the diagnosis; although records indicate bronchiectasis) presented with weakness and dizziness and was found to have sodium of 110. She was admitted with hypertonic saline. Nephrology has evaluated the patient. Sodium is improving. She has been transferred out of the stepdown unit. Her appetite is still very poor.  Assessment & Plan:   Principal Problem:   Hyponatremia Active Problems:   Dementia   HTN (hypertension)   Protein-calorie malnutrition, severe   Acute hyponatremia with hypochloremia - Probably secondary to dehydration and very poor oral intake and diuretic use including hydrochlorothiazide. - Sodium level has significantly improved. Status post 3% saline. Currently on oral salt tablets.  Will follow further recommendations from nephrology.  - Hold hydrochlorothiazide.  Dehydration and very poor oral intake - Questionable cause. CAT scan of the chest abdomen pelvis did not show any evidence of malignancy - Dietary evaluation  Mild leukocytosis Probably reactive. Monitor off antibiotics.   Hypertension Blood pressure on the higher side. Monitor blood pressure. Hold diuretics for now. Increase Coreg to 12.5 mg twice a day  Dyslipidemia Continue simvastatin  Dementia Monitor mental status. Fall precautions. Hold memantine for now. PT evaluation. Care management consult for home healthneeds. Patient remains intermittently confused probably secondary to delirium on top of dementia. Seroquel was started yesterday evening, patient is very sleepy this morning. Decrease the dose of Seroquel to 12.5 mg at  bedtime  Severe depression with very poor appetite Psychiatric consultation is pending  Severe malnutrition Follow nutrition/dietary recommendations  Mild transaminitis with elevated bilirubin Probably from starvation. Resolved   DVT prophylaxis:Lovenox  Code Status:DO NOT RESUSCITATE Family Communication:no family present at bedside  Disposition Plan:Patient might need placement. Will follow-up with social worker  Consultants:Nephrology  Procedures:None  Antimicrobials:None   Subjective: Patient seen and examined at bedside. She is sleepy, wakes up slightly on calling her name and doesn't answer much questions. No overnight fever, vomiting reported.  Objective: Vitals:   02/09/17 1600 02/09/17 1650 02/09/17 2051 02/10/17 0632  BP: (!) 189/78 (!) 175/73 (!) 150/71 139/80  Pulse:  76 77 84  Resp: 20 20 18 16   Temp:  97.4 F (36.3 C) 97.6 F (36.4 C) 98.2 F (36.8 C)  TempSrc:  Oral Oral Oral  SpO2:  93% 100% 100%  Weight:      Height:        Intake/Output Summary (Last 24 hours) at 02/10/17 1059 Last data filed at 02/10/17 0830  Gross per 24 hour  Intake               60 ml  Output               44 ml  Net               16 ml   Filed Weights   02/07/17 1422 02/08/17 0502  Weight: 46.7 kg (103 lb) 47.7 kg (105 lb 2.6 oz)    Examination:  General exam: Appears calm and comfortable  Respiratory system: Bilateral decreased breath sound at bases With scattered crackle Cardiovascular system: S1 & S2 heard, rate controlled  Gastrointestinal system: Abdomen is nondistended, soft and nontender. Normal  bowel sounds heard. Extremities: No cyanosis, clubbing, edema     Data Reviewed: I have personally reviewed following labs and imaging studies  CBC:  Recent Labs Lab 02/07/17 1529 02/08/17 0338  WBC 11.5* 11.2*  HGB 13.2 13.5  HCT 35.6* 36.5  MCV 79.5 79.5  PLT 276 063   Basic Metabolic Panel:  Recent Labs Lab 02/08/17 1619  02/09/17 0419 02/09/17 0655 02/09/17 1650 02/10/17 0709  NA 116* 122*  122* 124* 121* 124*  K 4.1 3.9  3.9 4.0 3.9 3.6  CL 83* 91*  91* 91* 86* 88*  CO2 25 25  25 24 26 28   GLUCOSE 138* 121*  120* 123* 115* 94  BUN 6 <5*  <5* <5* 7 6  CREATININE 0.57 0.56  0.57 0.54 0.61 0.64  CALCIUM 8.9 8.7*  8.7* 9.0 9.5 9.0  MG  --   --   --   --  1.7  PHOS 1.6*  --   --   --   --    GFR: Estimated Creatinine Clearance: 42.2 mL/min (by C-G formula based on SCr of 0.64 mg/dL). Liver Function Tests:  Recent Labs Lab 02/07/17 1529 02/08/17 1619 02/09/17 0419  AST 59*  --  32  ALT 23  --  19  ALKPHOS 95  --  84  BILITOT 1.7*  --  1.1  PROT 7.1  --  6.5  ALBUMIN 3.9 3.7 3.7   No results for input(s): LIPASE, AMYLASE in the last 168 hours. No results for input(s): AMMONIA in the last 168 hours. Coagulation Profile: No results for input(s): INR, PROTIME in the last 168 hours. Cardiac Enzymes: No results for input(s): CKTOTAL, CKMB, CKMBINDEX, TROPONINI in the last 168 hours. BNP (last 3 results) No results for input(s): PROBNP in the last 8760 hours. HbA1C: No results for input(s): HGBA1C in the last 72 hours. CBG:  Recent Labs Lab 02/07/17 1433  GLUCAP 174*   Lipid Profile: No results for input(s): CHOL, HDL, LDLCALC, TRIG, CHOLHDL, LDLDIRECT in the last 72 hours. Thyroid Function Tests:  Recent Labs  02/08/17 0338  TSH 1.053   Anemia Panel: No results for input(s): VITAMINB12, FOLATE, FERRITIN, TIBC, IRON, RETICCTPCT in the last 72 hours. Sepsis Labs: No results for input(s): PROCALCITON, LATICACIDVEN in the last 168 hours.  Recent Results (from the past 240 hour(s))  MRSA PCR Screening     Status: None   Collection Time: 02/07/17  8:24 PM  Result Value Ref Range Status   MRSA by PCR NEGATIVE NEGATIVE Final    Comment:        The GeneXpert MRSA Assay (FDA approved for NASAL specimens only), is one component of a comprehensive MRSA  colonization surveillance program. It is not intended to diagnose MRSA infection nor to guide or monitor treatment for MRSA infections.          Radiology Studies: Ct Chest W Contrast  Result Date: 02/09/2017 CLINICAL DATA:  82 year old female with shortness of breath, abdominal and pelvic pain and unexplained weight loss. EXAM: CT CHEST, ABDOMEN, AND PELVIS WITH CONTRAST TECHNIQUE: Multidetector CT imaging of the chest, abdomen and pelvis was performed following the standard protocol during bolus administration of intravenous contrast. CONTRAST:  143mL ISOVUE-300 IOPAMIDOL (ISOVUE-300) INJECTION 61% COMPARISON:  04/05/2015 chest CT, 08/03/2007 abdominal CT and other studies FINDINGS: CT CHEST FINDINGS Cardiovascular: Cardiomegaly, coronary artery and thoracic aortic calcifications again noted. There is no evidence of thoracic aortic aneurysm or pericardial effusion. Mediastinum/Nodes: No enlarged mediastinal, hilar, or axillary  lymph nodes. Thyroid gland, trachea, and esophagus demonstrate no significant findings. Lungs/Pleura: Cylindrical bronchiectasis within the upper lobes bilaterally noted as well as middle lobe and lingular atelectasis. Mild tree-in-bud opacities within both upper lobes and within the anterior right lower lobe probably represent infection. A stable 8 mm left lower lobe nodule is identified. No airspace disease, consolidation or mass identified. There is no evidence of pleural effusion or pneumothorax. Musculoskeletal: No chest wall mass or suspicious bone lesions identified. CT ABDOMEN PELVIS FINDINGS Hepatobiliary: The liver and gallbladder are unremarkable. There is no evidence of biliary dilatation. Pancreas: Unremarkable Spleen: Unremarkable Adrenals/Urinary Tract: Moderate to marked bladder distention noted. The kidneys and adrenal glands are unremarkable. Stomach/Bowel: No bowel wall thickening bowel obstruction or inflammatory changes. Mild colonic diverticulosis noted  without diverticulitis. Vascular/Lymphatic: Aortic atherosclerosis. No enlarged abdominal or pelvic lymph nodes. Reproductive: Calcified fibroid noted. The adnexal regions are unremarkable Other: No ascites, abscess or pneumoperitoneum. Musculoskeletal: No acute or significant osseous findings. IMPRESSION: Mild tree-in-bud opacities within the lungs, which has waxed and waned over several studies, and likely represent infection/MAI. Unchanged bilateral upper lobe cylindrical bronchiectasis and middle lobe/lingular atelectasis. Cardiomegaly and coronary artery disease. No evidence of acute abnormality within the abdomen or pelvis. Aortic Atherosclerosis (ICD10-I70.0). Electronically Signed   By: Margarette Canada M.D.   On: 02/09/2017 18:14   Ct Abdomen Pelvis W Contrast  Result Date: 02/09/2017 CLINICAL DATA:  82 year old female with shortness of breath, abdominal and pelvic pain and unexplained weight loss. EXAM: CT CHEST, ABDOMEN, AND PELVIS WITH CONTRAST TECHNIQUE: Multidetector CT imaging of the chest, abdomen and pelvis was performed following the standard protocol during bolus administration of intravenous contrast. CONTRAST:  175mL ISOVUE-300 IOPAMIDOL (ISOVUE-300) INJECTION 61% COMPARISON:  04/05/2015 chest CT, 08/03/2007 abdominal CT and other studies FINDINGS: CT CHEST FINDINGS Cardiovascular: Cardiomegaly, coronary artery and thoracic aortic calcifications again noted. There is no evidence of thoracic aortic aneurysm or pericardial effusion. Mediastinum/Nodes: No enlarged mediastinal, hilar, or axillary lymph nodes. Thyroid gland, trachea, and esophagus demonstrate no significant findings. Lungs/Pleura: Cylindrical bronchiectasis within the upper lobes bilaterally noted as well as middle lobe and lingular atelectasis. Mild tree-in-bud opacities within both upper lobes and within the anterior right lower lobe probably represent infection. A stable 8 mm left lower lobe nodule is identified. No airspace  disease, consolidation or mass identified. There is no evidence of pleural effusion or pneumothorax. Musculoskeletal: No chest wall mass or suspicious bone lesions identified. CT ABDOMEN PELVIS FINDINGS Hepatobiliary: The liver and gallbladder are unremarkable. There is no evidence of biliary dilatation. Pancreas: Unremarkable Spleen: Unremarkable Adrenals/Urinary Tract: Moderate to marked bladder distention noted. The kidneys and adrenal glands are unremarkable. Stomach/Bowel: No bowel wall thickening bowel obstruction or inflammatory changes. Mild colonic diverticulosis noted without diverticulitis. Vascular/Lymphatic: Aortic atherosclerosis. No enlarged abdominal or pelvic lymph nodes. Reproductive: Calcified fibroid noted. The adnexal regions are unremarkable Other: No ascites, abscess or pneumoperitoneum. Musculoskeletal: No acute or significant osseous findings. IMPRESSION: Mild tree-in-bud opacities within the lungs, which has waxed and waned over several studies, and likely represent infection/MAI. Unchanged bilateral upper lobe cylindrical bronchiectasis and middle lobe/lingular atelectasis. Cardiomegaly and coronary artery disease. No evidence of acute abnormality within the abdomen or pelvis. Aortic Atherosclerosis (ICD10-I70.0). Electronically Signed   By: Margarette Canada M.D.   On: 02/09/2017 18:14        Scheduled Meds: . carvedilol  6.25 mg Oral BID WC  . enoxaparin (LOVENOX) injection  40 mg Subcutaneous Q24H  . feeding supplement  1 Container Oral  BID BM  . feeding supplement (ENSURE ENLIVE)  237 mL Oral BID BM  . multivitamin with minerals  1 tablet Oral Daily  . QUEtiapine  12.5 mg Oral QHS  . simvastatin  40 mg Oral Daily  . sodium chloride flush  3 mL Intravenous Q12H  . sodium chloride  2 g Oral TID WC   Continuous Infusions:   LOS: 3 days        Aline August, MD [facility] Pager [phone]  If 7PM-7AM, please contact night-coverage [email or web address] Password  [facility] 02/10/2017, 10:59 AM

## 2017-02-10 NOTE — Progress Notes (Signed)
PT Cancellation Note  Patient Details Name: [name] [name] MRN: [id number] DOB: 1935-03-23   Cancelled Treatment:     pt sleeping with safety sitter in room this morning.  Due to schedule, I was unable to return this afternoon.  Pt has been evaluated by Physical Therapy on 6/24 with no follow up rec.  Will attempt to see in am.   Rica Koyanagi  PTA [facility]  Acute  Rehab Pager      [phone]

## 2017-02-11 DIAGNOSIS — E871 Hypo-osmolality and hyponatremia: Secondary | ICD-10-CM

## 2017-02-11 LAB — CBC WITH DIFFERENTIAL/PLATELET
Basophils Absolute: 0.1 10*3/uL (ref 0.0–0.1)
Basophils Relative: 1 %
EOS PCT: 5 %
Eosinophils Absolute: 0.4 10*3/uL (ref 0.0–0.7)
HCT: 32.5 % — ABNORMAL LOW (ref 36.0–46.0)
HEMOGLOBIN: 11.3 g/dL — AB (ref 12.0–15.0)
LYMPHS ABS: 1.8 10*3/uL (ref 0.7–4.0)
LYMPHS PCT: 22 %
MCH: 29.4 pg (ref 26.0–34.0)
MCHC: 34.8 g/dL (ref 30.0–36.0)
MCV: 84.4 fL (ref 78.0–100.0)
MONOS PCT: 12 %
Monocytes Absolute: 0.9 10*3/uL (ref 0.1–1.0)
Neutro Abs: 4.8 10*3/uL (ref 1.7–7.7)
Neutrophils Relative %: 60 %
PLATELETS: 219 10*3/uL (ref 150–400)
RBC: 3.85 MIL/uL — AB (ref 3.87–5.11)
RDW: 12.6 % (ref 11.5–15.5)
WBC: 7.9 10*3/uL (ref 4.0–10.5)

## 2017-02-11 LAB — BASIC METABOLIC PANEL
Anion gap: 8 (ref 5–15)
BUN: 9 mg/dL (ref 6–20)
CHLORIDE: 96 mmol/L — AB (ref 101–111)
CO2: 28 mmol/L (ref 22–32)
CREATININE: 0.59 mg/dL (ref 0.44–1.00)
Calcium: 9 mg/dL (ref 8.9–10.3)
GFR calc Af Amer: >60 mL/min (ref >60)
GFR calc non Af Amer: >60 mL/min (ref >60)
GLUCOSE: 92 mg/dL (ref 65–99)
POTASSIUM: 3.1 mmol/L — AB (ref 3.5–5.1)
Sodium: 132 mmol/L — ABNORMAL LOW (ref 135–145)

## 2017-02-11 LAB — MAGNESIUM: Magnesium: 2 mg/dL (ref 1.7–2.4)

## 2017-02-11 MED ORDER — SODIUM CHLORIDE 1 G PO TABS
1.0000 g | ORAL_TABLET | Freq: Two times a day (BID) | ORAL | Status: DC
  Administered 2017-02-11 – 2017-02-12 (×2): 1 g via ORAL
  Filled 2017-02-11 (×3): qty 1

## 2017-02-11 MED ORDER — POTASSIUM CHLORIDE CRYS ER 20 MEQ PO TBCR
40.0000 meq | EXTENDED_RELEASE_TABLET | ORAL | Status: AC
  Administered 2017-02-11 (×2): 40 meq via ORAL
  Filled 2017-02-11 (×2): qty 2

## 2017-02-11 MED ORDER — SENNOSIDES-DOCUSATE SODIUM 8.6-50 MG PO TABS
1.0000 | ORAL_TABLET | Freq: Two times a day (BID) | ORAL | Status: DC
  Administered 2017-02-11 – 2017-02-12 (×3): 1 via ORAL
  Filled 2017-02-11 (×3): qty 1

## 2017-02-11 MED ORDER — POLYETHYLENE GLYCOL 3350 17 G PO PACK
17.0000 g | PACK | Freq: Every day | ORAL | Status: DC
  Administered 2017-02-11 – 2017-02-12 (×2): 17 g via ORAL
  Filled 2017-02-11 (×2): qty 1

## 2017-02-11 NOTE — Progress Notes (Signed)
Pt is awake and alert sitting up in the chair. Pt densi pain at this time with no s/s of distress. I have assessed the patient and agree with the morning nurses assessment. Will continue to monitor

## 2017-02-11 NOTE — Care Management Important Message (Signed)
Important Message  Patient Details  Name: [name] [name] MRN: [id number] Date of Birth: [birth year]   Medicare Important Message Given:  Yes    Kerin Salen 02/11/2017, 12:18 [place] Message  Patient Details  Name: [name] [name] MRN: [id number] Date of Birth: 1935-04-29   Medicare Important Message Given:  Yes    Kerin Salen 02/11/2017, 12:17 PM

## 2017-02-11 NOTE — Progress Notes (Signed)
Subjective:  Na up to 135   Objective Vital signs in last 24 hours: Vitals:   02/10/17 0632 02/10/17 1350 02/10/17 2104 02/11/17 0509  BP: 139/80 (!) 102/44 (!) 118/55 (!) 113/50  Pulse: 84 75 79 80  Resp: 16 18 16 19   Temp: 98.2 F (36.8 C) 98.4 F (36.9 C) 97.5 F (36.4 C) 97.5 F (36.4 C)  TempSrc: Oral Oral Oral Oral  SpO2: 100% 98% 99% 98%  Weight:      Height:       Weight change:   Intake/Output Summary (Last 24 hours) at 02/11/17 0931 Last data filed at 02/11/17 7858  Gross per 24 hour  Intake             1135 ml  Output                0 ml  Net             1135 ml   Physical: General: alert in bed but repeatedly asking about medicine- family says she looks better but is not thinking any better yet Heart:RRR Lungs: mostly clear Abdomen: soft, non tender Extremities: no edema  Assessment/Plan: 1. Hyponatremia - euvolemic due to meds (SSRI/ thiazides) and tea+toast syndrome (low solute intake). S/P 3%, and now responding strongly to salt tab/ fluid restrict. Will lower NaCl to 1 gm bid and would dc altogether in 2-3 days or if serum Na > 135. Avoid SSRI and thiazides in this patient in the future. I dc'd the Prozac. Will sign off.  2.  HTN -getting coreg now   3.  Dementia: rec stop memantine for now 4.  Depression: rec avoid SSRIs    Kelly Splinter MD [facility] pgr [phone]   02/10/2017, 4:52 PM    Labs: Basic Metabolic Panel:  Recent Labs Lab 02/08/17 1619  02/09/17 1650 02/10/17 0709 02/11/17 0505  NA 116*  < > 121* 124* 132*  K 4.1  < > 3.9 3.6 3.1*  CL 83*  < > 86* 88* 96*  CO2 25  < > 26 28 28   GLUCOSE 138*  < > 115* 94 92  BUN 6  < > 7 6 9   CREATININE 0.57  < > 0.61 0.64 0.59  CALCIUM 8.9  < > 9.5 9.0 9.0  PHOS 1.6*  --   --   --   --   < > = values in this interval not displayed. Liver Function Tests:  Recent Labs Lab 02/07/17 1529 02/08/17 1619 02/09/17 0419  AST 59*  --  32  ALT 23  --  19  ALKPHOS 95   --  84  BILITOT 1.7*  --  1.1  PROT 7.1  --  6.5  ALBUMIN 3.9 3.7 3.7   No results for input(s): LIPASE, AMYLASE in the last 168 hours. No results for input(s): AMMONIA in the last 168 hours. CBC:  Recent Labs Lab 02/07/17 1529 02/08/17 0338 02/11/17 0505  WBC 11.5* 11.2* 7.9  NEUTROABS  --   --  4.8  HGB 13.2 13.5 11.3*  HCT 35.6* 36.5 32.5*  MCV 79.5 79.5 84.4  PLT 276 268 219    Scheduled Medications: . albuterol  5 mg Nebulization Once  . carvedilol  12.5 mg Oral BID WC  . donepezil  5 mg Oral QHS  . enoxaparin (LOVENOX) injection  40 mg Subcutaneous Q24H  . feeding supplement  1 Container Oral BID BM  . feeding supplement (ENSURE ENLIVE)  237 mL Oral BID BM  . FLUoxetine  10 mg Oral Daily  . ipratropium  0.5 mg Nebulization Once  . multivitamin with minerals  1 tablet Oral Daily  . potassium chloride  40 mEq Oral Q4H  . QUEtiapine  12.5 mg Oral QHS  . simvastatin  40 mg Oral Daily  . sodium chloride flush  3 mL Intravenous Q12H  . sodium chloride  1 g Oral BID WC    have reviewed scheduled and prn medications.     02/11/2017,9:31 AM  LOS: 4 days

## 2017-02-11 NOTE — Progress Notes (Signed)
Physical Therapy Treatment Patient Details Name: [name] [name] MRN: [id number] DOB: 13-Jun-1935 Today's Date: 02/11/2017    History of Present Illness Pt admit with h/o recent diagnosis of dementia, and reported by fmaily tp have increased weakness and dizziness. Found to have hyponatremia .     PT Comments    Assisted OOB to amb to bathroom then in hallway hand held assist as pt was unable to safely use walker due to impaired cognition. Limited distance due to c/o B LE weakness.  "I haven't been moving much hear".  Pleasant.  Following all commands but unaware of current location/situation.   Follow Up Recommendations  No PT follow up     Equipment Recommendations  None recommended by PT    Recommendations for Other Services       Precautions / Restrictions Precautions Precautions: Fall Precaution Comments: bed/chair alarm     dementia Restrictions Weight Bearing Restrictions: No    Mobility  Bed Mobility Overal bed mobility: Needs Assistance Bed Mobility: Supine to Sit     Supine to sit: Supervision     General bed mobility comments: simple/functional VC's for direction  Transfers Overall transfer level: Needs assistance Equipment used: Rolling walker (2 wheeled) Transfers: Sit to/from Stand Sit to Stand: Supervision;Min guard         General transfer comment: simple/functional VC's for direction and safety with turns  Ambulation/Gait Ambulation/Gait assistance: Min guard Ambulation Distance (Feet): 58 Feet Assistive device: 1 person hand held assist Gait Pattern/deviations: Step-to pattern;Decreased step length - right;Decreased step length - left;Narrow base of support Gait velocity: WFL   General Gait Details: pt unable to safely use walker due to impaired cognition.  Hand held assist to amb to bathroom then in hallway.  Occassional need to hold to rail/wall.     Stairs            Wheelchair Mobility    Modified Rankin (Stroke Patients  Only)       Balance                                            Cognition Arousal/Alertness: Awake/alert                                     General Comments: AxO x 1 following one/two step commands   demonstrates short term memory loss      Exercises      General Comments        Pertinent Vitals/Pain Pain Assessment: No/denies pain    Home Living                      Prior Function            PT Goals (current goals can now be found in the care plan section) Progress towards PT goals: Progressing toward goals    Frequency    Min 3X/week      PT Plan Current plan remains appropriate    Co-evaluation              AM-PAC PT "6 Clicks" Daily Activity  Outcome Measure  Difficulty turning over in bed (including adjusting bedclothes, sheets and blankets)?: Total Difficulty moving from lying on back to sitting on the side of the bed? : Total Difficulty sitting  down on and standing up from a chair with arms (e.g., wheelchair, bedside commode, etc,.)?: Total Help needed moving to and from a bed to chair (including a wheelchair)?: A Little Help needed walking in hospital room?: A Little Help needed climbing 3-5 steps with a railing? : A Little 6 Click Score: 12    End of Session Equipment Utilized During Treatment: Gait belt Activity Tolerance: Patient tolerated treatment well Patient left: in chair;with chair alarm set;with call bell/phone within reach Nurse Communication: Mobility status PT Visit Diagnosis: Unsteadiness on feet (R26.81)     Time: 9201-0071 PT Time Calculation (min) (ACUTE ONLY): 15 min  Charges:  $Gait Training: 8-22 mins                    G Codes:       Rica Koyanagi  PTA [facility]  Acute  Rehab Pager      [phone]

## 2017-02-11 NOTE — Plan of Care (Signed)
Problem: Nutrition: Goal: Adequate nutrition will be maintained Outcome: Not Progressing Patient still has very little PO intake. Refuses to drink Ensure

## 2017-02-11 NOTE — Evaluation (Signed)
Clinical/Bedside Swallow Evaluation Patient Details  Name: [name] [name] MRN: [id number] Date of Birth: [birth year]  Today's Date: 02/11/2017 Time: SLP Start Time (ACUTE ONLY): 1555 SLP Stop Time (ACUTE ONLY): 1605 SLP Time Calculation (min) (ACUTE ONLY): 10 min  Past Medical History:  Past Medical History:  Diagnosis Date  . Heart murmur   . Hypercholesteremia   . Hypertension    Past Surgical History:  Past Surgical History:  Procedure Laterality Date  . VIDEO BRONCHOSCOPY Bilateral 04/16/2015   Procedure: VIDEO BRONCHOSCOPY WITHOUT FLUORO;  Surgeon: Collene Gobble, MD;  Location: [place];  Service: Cardiopulmonary;  Laterality: Bilateral;   HPI:  82 yo female adm to [facility] with AMS - diagnosed with hyponatremia, dizziness, weakness.  Pt has h/o dementia.  Swallow evaluation ordered.  Ct showed ? infection in tree bud opacities.  Pt intake listed as 100% and she is on RA.     Assessment / Plan / Recommendation Clinical Impression  Pt presents with functional oropharyngeal swallow ability, no indication of airway compromise or dysphagia with po observed.  Pt is on fluid restriction, therefore 3 ounce water test not conducted.  Negative CN exam and pt with clear voice with consumption of water and chocolate.  Of note, pt reports h/o reflux issues, denies this occuring in the last five years.  No SlP follow up, recommend continue regular/thin diet.   SLP Visit Diagnosis: Dysphagia, unspecified (R13.10)    Aspiration Risk  No limitations    Diet Recommendation Regular;Thin liquid   Liquid Administration via: Cup;Straw Medication Administration: Whole meds with liquid Supervision: Patient able to self feed Compensations: Slow rate;Small sips/bites    Other  Recommendations Oral Care Recommendations: Oral care BID   Follow up Recommendations   none     Frequency and Duration   n/a         Prognosis   n/a     Swallow Study   General Date of Onset: 02/11/17 HPI: 82  yo female adm to [facility] with AMS - diagnosed with hyponatremia, dizziness, weakness.  Pt has h/o dementia.  Swallow evaluation ordered.  Ct showed ? infection in tree bud opacities.  Pt intake listed as 100% and she is on RA.   Type of Study: Bedside Swallow Evaluation Diet Prior to this Study: Regular;Thin liquids Temperature Spikes Noted: No Respiratory Status: Room air History of Recent Intubation: No Behavior/Cognition: Alert;Cooperative;Pleasant mood Oral Cavity Assessment: Within Functional Limits Oral Care Completed by SLP: No Oral Cavity - Dentition: Adequate natural dentition Vision: Functional for self-feeding Self-Feeding Abilities: Able to feed self Patient Positioning: Upright in bed Baseline Vocal Quality: Normal Volitional Cough: Strong Volitional Swallow: Able to elicit    Oral/Motor/Sensory Function Overall Oral Motor/Sensory Function: Within functional limits   Ice Chips Ice chips: Not tested   Thin Liquid Thin Liquid: Within functional limits Presentation: Straw    Nectar Thick Nectar Thick Liquid: Not tested   Honey Thick Honey Thick Liquid: Not tested   Puree Puree: Not tested   Solid   GO   Solid: Within functional limits Presentation: Self Fed;Spoon        Luanna Salk, [place] [facility] SLP [phone]

## 2017-02-11 NOTE — Progress Notes (Signed)
Patient ID: [name] [name], female   DOB: 1935-03-03, 82 y.o.   MRN: [id number]  PROGRESS NOTE    [name] [name]  QPY:[id number] DOB: 1935/06/17 DOA: 02/07/2017 PCP: Heywood Bene, PA-C   Brief Narrative:  82 year female with history of recent diagnosis of dementia, hypertension, dyslipidemia, some sort of lung problem (patient and family members unaware of the diagnosis; although records indicate bronchiectasis) presented with weakness and dizziness and was found to have sodium of 110. She was admitted with hypertonic saline. Nephrology has evaluated the patient. Sodium is improving. She has been transferred out of the stepdown unit. Her appetite is still very poor.  Assessment & Plan:   Principal Problem:   Hyponatremia Active Problems:   Dementia   HTN (hypertension)   Protein-calorie malnutrition, severe   Acute hyponatremia with hypochloremia - Probably secondary to dehydration and very poor oral intake and diuretic use including hydrochlorothiazide and siadh  - Sodium level has significantly improved. Status post 3% saline. Currently on oral salt tablets and fluids restriction.  Will follow further recommendations from nephrology.  - Hold hydrochlorothiazide.  Dehydration and very poor oral intake - Questionable cause. CAT scan of the chest abdomen pelvis did not show any evidence of malignancy - Dietary evaluation  Mild leukocytosis Probably reactive. Monitor off antibiotics.  sbc normlaized  Hypertension Blood pressure on the higher side. Monitor blood pressure. Hold diuretics for now. Increase Coreg to 12.5 mg twice a day  Dyslipidemia Continue simvastatin  Dementia Monitor mental status. Fall precautions. Hold memantine for now. PT evaluation. Care management consult for home healthneeds. Patient remains intermittently confused probably secondary to delirium on top of dementia. Patient was sleepy on Seroquel 25mg , she is better on reduced does of  seroquel at 12.5mg  qhs.  Severe depression with very poor appetite Psychiatric consulted, input appreciated will avoid ssri for now due to hyponatremia  Severe malnutrition Follow nutrition/dietary recommendations  Mild transaminitis with elevated bilirubin Probably from starvation. Resolved   DVT prophylaxis:Lovenox  Code Status:DO NOT RESUSCITATE Family Communication:dughter over the phone on 6/27 Disposition Plan: Home with home health, will benefit from memory care placement  Consultants:Nephrology  Procedures:None  Antimicrobials:None   Subjective: She is confused, but no agitation, she is alert and talking to her daughter over the phone She denies pain, no fever, no cough.  Objective: Vitals:   02/10/17 2104 02/11/17 0509 02/11/17 0945 02/11/17 1416  BP: (!) 118/55 (!) 113/50 129/65 (!) 105/44  Pulse: 79 80 78 90  Resp: 16 19 18 20   Temp: 97.5 F (36.4 C) 97.5 F (36.4 C) 97.9 F (36.6 C) 98.2 F (36.8 C)  TempSrc: Oral Oral Oral Oral  SpO2: 99% 98% 100% 100%  Weight:      Height:        Intake/Output Summary (Last 24 hours) at 02/11/17 1728 Last data filed at 02/11/17 1216  Gross per 24 hour  Intake             1040 ml  Output                0 ml  Net             1040 ml   Filed Weights   02/07/17 1422 02/08/17 0502  Weight: 46.7 kg (103 lb) 47.7 kg (105 lb 2.6 oz)    Examination:  General exam: confused, but no agitation  Respiratory system: Bilateral decreased breath sound at bases With scattered crackle Cardiovascular system: S1 & S2 heard,  rate controlled  Gastrointestinal system: Abdomen is nondistended, soft and nontender. Normal bowel sounds heard. Extremities: No cyanosis, clubbing, edema     Data Reviewed: I have personally reviewed following labs and imaging studies  CBC:  Recent Labs Lab 02/07/17 1529 02/08/17 0338 02/11/17 0505  WBC 11.5* 11.2* 7.9  NEUTROABS  --   --  4.8  HGB 13.2 13.5 11.3*  HCT 35.6*  36.5 32.5*  MCV 79.5 79.5 84.4  PLT 276 268 779   Basic Metabolic Panel:  Recent Labs Lab 02/08/17 1619 02/09/17 0419 02/09/17 0655 02/09/17 1650 02/10/17 0709 02/11/17 0505  NA 116* 122*  122* 124* 121* 124* 132*  K 4.1 3.9  3.9 4.0 3.9 3.6 3.1*  CL 83* 91*  91* 91* 86* 88* 96*  CO2 25 25  25 24 26 28 28   GLUCOSE 138* 121*  120* 123* 115* 94 92  BUN 6 <5*  <5* <5* 7 6 9   CREATININE 0.57 0.56  0.57 0.54 0.61 0.64 0.59  CALCIUM 8.9 8.7*  8.7* 9.0 9.5 9.0 9.0  MG  --   --   --   --  1.7 2.0  PHOS 1.6*  --   --   --   --   --    GFR: Estimated Creatinine Clearance: 42.2 mL/min (by C-G formula based on SCr of 0.59 mg/dL). Liver Function Tests:  Recent Labs Lab 02/07/17 1529 02/08/17 1619 02/09/17 0419  AST 59*  --  32  ALT 23  --  19  ALKPHOS 95  --  84  BILITOT 1.7*  --  1.1  PROT 7.1  --  6.5  ALBUMIN 3.9 3.7 3.7   No results for input(s): LIPASE, AMYLASE in the last 168 hours. No results for input(s): AMMONIA in the last 168 hours. Coagulation Profile: No results for input(s): INR, PROTIME in the last 168 hours. Cardiac Enzymes: No results for input(s): CKTOTAL, CKMB, CKMBINDEX, TROPONINI in the last 168 hours. BNP (last 3 results) No results for input(s): PROBNP in the last 8760 hours. HbA1C: No results for input(s): HGBA1C in the last 72 hours. CBG:  Recent Labs Lab 02/07/17 1433  GLUCAP 174*   Lipid Profile: No results for input(s): CHOL, HDL, LDLCALC, TRIG, CHOLHDL, LDLDIRECT in the last 72 hours. Thyroid Function Tests: No results for input(s): TSH, T4TOTAL, FREET4, T3FREE, THYROIDAB in the last 72 hours. Anemia Panel: No results for input(s): VITAMINB12, FOLATE, FERRITIN, TIBC, IRON, RETICCTPCT in the last 72 hours. Sepsis Labs: No results for input(s): PROCALCITON, LATICACIDVEN in the last 168 hours.  Recent Results (from the past 240 hour(s))  MRSA PCR Screening     Status: None   Collection Time: 02/07/17  8:24 PM  Result Value Ref  Range Status   MRSA by PCR NEGATIVE NEGATIVE Final    Comment:        The GeneXpert MRSA Assay (FDA approved for NASAL specimens only), is one component of a comprehensive MRSA colonization surveillance program. It is not intended to diagnose MRSA infection nor to guide or monitor treatment for MRSA infections.          Radiology Studies: Ct Chest W Contrast  Result Date: 02/09/2017 CLINICAL DATA:  82 year old female with shortness of breath, abdominal and pelvic pain and unexplained weight loss. EXAM: CT CHEST, ABDOMEN, AND PELVIS WITH CONTRAST TECHNIQUE: Multidetector CT imaging of the chest, abdomen and pelvis was performed following the standard protocol during bolus administration of intravenous contrast. CONTRAST:  174mL ISOVUE-300 IOPAMIDOL (ISOVUE-300)  INJECTION 61% COMPARISON:  04/05/2015 chest CT, 08/03/2007 abdominal CT and other studies FINDINGS: CT CHEST FINDINGS Cardiovascular: Cardiomegaly, coronary artery and thoracic aortic calcifications again noted. There is no evidence of thoracic aortic aneurysm or pericardial effusion. Mediastinum/Nodes: No enlarged mediastinal, hilar, or axillary lymph nodes. Thyroid gland, trachea, and esophagus demonstrate no significant findings. Lungs/Pleura: Cylindrical bronchiectasis within the upper lobes bilaterally noted as well as middle lobe and lingular atelectasis. Mild tree-in-bud opacities within both upper lobes and within the anterior right lower lobe probably represent infection. A stable 8 mm left lower lobe nodule is identified. No airspace disease, consolidation or mass identified. There is no evidence of pleural effusion or pneumothorax. Musculoskeletal: No chest wall mass or suspicious bone lesions identified. CT ABDOMEN PELVIS FINDINGS Hepatobiliary: The liver and gallbladder are unremarkable. There is no evidence of biliary dilatation. Pancreas: Unremarkable Spleen: Unremarkable Adrenals/Urinary Tract: Moderate to marked bladder  distention noted. The kidneys and adrenal glands are unremarkable. Stomach/Bowel: No bowel wall thickening bowel obstruction or inflammatory changes. Mild colonic diverticulosis noted without diverticulitis. Vascular/Lymphatic: Aortic atherosclerosis. No enlarged abdominal or pelvic lymph nodes. Reproductive: Calcified fibroid noted. The adnexal regions are unremarkable Other: No ascites, abscess or pneumoperitoneum. Musculoskeletal: No acute or significant osseous findings. IMPRESSION: Mild tree-in-bud opacities within the lungs, which has waxed and waned over several studies, and likely represent infection/MAI. Unchanged bilateral upper lobe cylindrical bronchiectasis and middle lobe/lingular atelectasis. Cardiomegaly and coronary artery disease. No evidence of acute abnormality within the abdomen or pelvis. Aortic Atherosclerosis (ICD10-I70.0). Electronically Signed   By: Margarette Canada M.D.   On: 02/09/2017 18:14   Ct Abdomen Pelvis W Contrast  Result Date: 02/09/2017 CLINICAL DATA:  82 year old female with shortness of breath, abdominal and pelvic pain and unexplained weight loss. EXAM: CT CHEST, ABDOMEN, AND PELVIS WITH CONTRAST TECHNIQUE: Multidetector CT imaging of the chest, abdomen and pelvis was performed following the standard protocol during bolus administration of intravenous contrast. CONTRAST:  194mL ISOVUE-300 IOPAMIDOL (ISOVUE-300) INJECTION 61% COMPARISON:  04/05/2015 chest CT, 08/03/2007 abdominal CT and other studies FINDINGS: CT CHEST FINDINGS Cardiovascular: Cardiomegaly, coronary artery and thoracic aortic calcifications again noted. There is no evidence of thoracic aortic aneurysm or pericardial effusion. Mediastinum/Nodes: No enlarged mediastinal, hilar, or axillary lymph nodes. Thyroid gland, trachea, and esophagus demonstrate no significant findings. Lungs/Pleura: Cylindrical bronchiectasis within the upper lobes bilaterally noted as well as middle lobe and lingular atelectasis. Mild  tree-in-bud opacities within both upper lobes and within the anterior right lower lobe probably represent infection. A stable 8 mm left lower lobe nodule is identified. No airspace disease, consolidation or mass identified. There is no evidence of pleural effusion or pneumothorax. Musculoskeletal: No chest wall mass or suspicious bone lesions identified. CT ABDOMEN PELVIS FINDINGS Hepatobiliary: The liver and gallbladder are unremarkable. There is no evidence of biliary dilatation. Pancreas: Unremarkable Spleen: Unremarkable Adrenals/Urinary Tract: Moderate to marked bladder distention noted. The kidneys and adrenal glands are unremarkable. Stomach/Bowel: No bowel wall thickening bowel obstruction or inflammatory changes. Mild colonic diverticulosis noted without diverticulitis. Vascular/Lymphatic: Aortic atherosclerosis. No enlarged abdominal or pelvic lymph nodes. Reproductive: Calcified fibroid noted. The adnexal regions are unremarkable Other: No ascites, abscess or pneumoperitoneum. Musculoskeletal: No acute or significant osseous findings. IMPRESSION: Mild tree-in-bud opacities within the lungs, which has waxed and waned over several studies, and likely represent infection/MAI. Unchanged bilateral upper lobe cylindrical bronchiectasis and middle lobe/lingular atelectasis. Cardiomegaly and coronary artery disease. No evidence of acute abnormality within the abdomen or pelvis. Aortic Atherosclerosis (ICD10-I70.0). Electronically Signed  By: Margarette Canada M.D.   On: 02/09/2017 18:14        Scheduled Meds: . albuterol  5 mg Nebulization Once  . carvedilol  12.5 mg Oral BID WC  . donepezil  5 mg Oral QHS  . enoxaparin (LOVENOX) injection  40 mg Subcutaneous Q24H  . feeding supplement  1 Container Oral BID BM  . feeding supplement (ENSURE ENLIVE)  237 mL Oral BID BM  . ipratropium  0.5 mg Nebulization Once  . multivitamin with minerals  1 tablet Oral Daily  . polyethylene glycol  17 g Oral Daily  .  QUEtiapine  12.5 mg Oral QHS  . senna-docusate  1 tablet Oral BID  . simvastatin  40 mg Oral Daily  . sodium chloride flush  3 mL Intravenous Q12H  . sodium chloride  1 g Oral BID WC   Continuous Infusions:   LOS: 4 days     Time spent> 49mins   Ladena Jacquez, MD PhD [facility] Pager [phone]  If 7PM-7AM, please contact night-coverage [email or web address] Password [facility] 02/11/2017, 5:28 PM

## 2017-02-11 NOTE — Progress Notes (Signed)
Nutrition Follow Up Note  DOCUMENTATION CODES:   Severe malnutrition in context of chronic illness, Severe malnutrition in context of acute illness/injury, Underweight  INTERVENTION:   Ensure Enlive BID, each supplement provides 350 kcal and 20 grams of protein  Boost Breeze BID to be used to give medications, each supplement provides 250 kcal and 9 grams of protein  Multivitamin with minerals.   Continue to encourage PO intakes of meals and supplements.   NUTRITION DIAGNOSIS:   Malnutrition (severe) related to chronic illness, acute illness (dementia, bronchiectasis; recent flu) as evidenced by severe depletion of muscle mass, severe depletion of body fat.  GOAL:   Patient will meet greater than or equal to 90% of their needs  MONITOR:   PO intake, Supplement acceptance, Weight trends, Labs  ASSESSMENT:   82 y.o. female with medical history significant of recent diagnosis of dementia, hypertension, dyslipidemia, some sort of lung problem (patient and family members unaware of the diagnosis; although records indicate bronchiectasis) presented with worsening weakness and dizziness. Patient is a poor historian and and is confused. Reliability of the history is poor. Family members present at bedside don't live the patient. As per the family members, patient was recently diagnosed with dementia few weeks ago and was started on a medication. Patient's appetite has been very poor over the last 2-3 weeks. Patient has become progressively very weak and today she was very weak and dizzy so she was brought to the emergency room.  Spoke to RN today, pt eating well. Patient being offered Ensure and Boost Breeze. Sodium improving. No new weight since admit. Continue to encourage intake of meals and supplements.    Medications reviewed and include: lovenox, MVI, miralax, KCl, senokot, NaCl tabs  Labs reviewed: Na 132(L), K 3.1(L), Cl 96(L)  Diet Order:  Diet regular Room service appropriate?  Yes; Fluid consistency: Thin  Skin:  Reviewed, no issues  Last BM:  PTA (unknown)  Height:   Ht Readings from Last 1 Encounters:  02/07/17 5\' 3"  (1.6 m)    Weight:   Wt Readings from Last 1 Encounters:  02/08/17 105 lb 2.6 oz (47.7 kg)    Ideal Body Weight:  52.27 kg  BMI:  Body mass index is 18.63 kg/m.  Estimated Nutritional Needs:   Kcal:  1430-1620 (30-34 kcal/kg)  Protein:  48-57 grams (1-1.2 grams/kg)  Fluid:  1.5 L/day  EDUCATION NEEDS:   No education needs identified at this time  Koleen Distance MS, RD, LDN Pager #[phone] After Hours Pager: [phone]

## 2017-02-12 LAB — BASIC METABOLIC PANEL
Anion gap: 8 (ref 5–15)
BUN: 10 mg/dL (ref 6–20)
CHLORIDE: 98 mmol/L — AB (ref 101–111)
CO2: 30 mmol/L (ref 22–32)
CREATININE: 0.67 mg/dL (ref 0.44–1.00)
Calcium: 9.1 mg/dL (ref 8.9–10.3)
GFR calc Af Amer: >60 mL/min (ref >60)
GFR calc non Af Amer: >60 mL/min (ref >60)
GLUCOSE: 101 mg/dL — AB (ref 65–99)
POTASSIUM: 4.7 mmol/L (ref 3.5–5.1)
Sodium: 136 mmol/L (ref 135–145)

## 2017-02-12 LAB — MAGNESIUM: MAGNESIUM: 1.9 mg/dL (ref 1.7–2.4)

## 2017-02-12 MED ORDER — ENSURE ENLIVE PO LIQD: 237.0000 mL | Freq: Two times a day (BID) | ORAL | 12 refills | Status: DC

## 2017-02-12 MED ORDER — DONEPEZIL HCL 5 MG PO TABS: 5.0000 mg | ORAL_TABLET | Freq: Every day | ORAL | 0 refills | Status: DC

## 2017-02-12 MED ORDER — QUETIAPINE FUMARATE 25 MG PO TABS: 12.5000 mg | ORAL_TABLET | Freq: Every day | ORAL | 0 refills | Status: DC

## 2017-02-12 MED ORDER — BOOST / RESOURCE BREEZE PO LIQD: 1.0000 | Freq: Two times a day (BID) | ORAL | 0 refills | Status: AC

## 2017-02-12 MED ORDER — ADULT MULTIVITAMIN W/MINERALS CH: 1.0000 | ORAL_TABLET | Freq: Every day | ORAL | 0 refills | Status: AC

## 2017-02-12 MED ORDER — SENNOSIDES-DOCUSATE SODIUM 8.6-50 MG PO TABS: 1.0000 | ORAL_TABLET | Freq: Every day | ORAL | 0 refills | Status: AC

## 2017-02-12 MED ORDER — POLYETHYLENE GLYCOL 3350 17 G PO PACK: 17.0000 g | PACK | Freq: Every day | ORAL | 0 refills | Status: AC

## 2017-02-12 MED ORDER — SODIUM CHLORIDE 1 G PO TABS: 1.0000 g | ORAL_TABLET | Freq: Two times a day (BID) | ORAL | 0 refills | Status: AC

## 2017-02-12 MED ORDER — CARVEDILOL 12.5 MG PO TABS: 12.5000 mg | ORAL_TABLET | Freq: Two times a day (BID) | ORAL | 0 refills | Status: DC

## 2017-02-12 NOTE — Care Management Note (Signed)
Case Management Note  Patient Details  Name: [name] [name] MRN: [id number] Date of Birth: 29-Mar-1935  Subjective/Objective:   Spoke to dtr [place] tel#[phone] about HHC-dtr agreed to [facility] aware of [place] orders & d/c today. Also provided dtr w/private duty care list as resource. Dtr will stay with patient, & make arrangements for caregivers. Dtr will provide own transp home.No further CM needs.                 Action/Plan:d/c home w/HHC.   Expected Discharge Date:  02/12/17               Expected Discharge Plan:  [place]  In-House Referral:     Discharge planning Services  CM Consult  Post Acute Care Choice:    Choice offered to:  Adult Children  DME Arranged:    DME Agency:     HH Arranged:  RN, PT, OT, Nurse's Aide, Social Work [employer] Agency:  [place]  Status of Service:  Completed, signed off  If discussed at [employer], dates discussed:    Additional Comments:  Dessa Phi, RN 02/12/2017, 11:43 AM

## 2017-02-12 NOTE — Progress Notes (Signed)
Pts IV removed with a clean and dry dressing intact. Pt denies pain at the time of discharge with no s/s of distress noted. Pt transported to the front entrance with nursing staff and family present.

## 2017-02-12 NOTE — Discharge Summary (Signed)
Discharge Summary  [name] [name] XBM:[id number] DOB: Jul 26, 1935  PCP: Heywood Bene, PA-C  Admit date: 02/07/2017 Discharge date: 02/12/2017  Time spent:> 54mins, more than 50% time spent on coordination of care  Recommendations for Outpatient Follow-up:  1. F/u with PMD within a week  for hospital discharge follow up, repeat cbc/bmp at follow up 2. Home health arranged,   Discharge Diagnoses:  Active Hospital Problems   Diagnosis Date Noted  . Hyponatremia 02/07/2017  . Protein-calorie malnutrition, severe 02/09/2017  . Dementia 02/07/2017  . HTN (hypertension) 02/07/2017    Resolved Hospital Problems   Diagnosis Date Noted Date Resolved  No resolved problems to display.    Discharge Condition: stable  Diet recommendation: regular diet  Filed Weights   02/07/17 1422 02/08/17 0502  Weight: 46.7 kg (103 lb) 47.7 kg (105 lb 2.6 oz)    History of present illness:  PCP: Stephens Shire, MD   Patient coming from: Home  I have personally briefly reviewed patient's old medical records in [place]  Chief Complaint: Weakness and dizziness   HPI: [name] [name] is a 82 y.o. female with medical history significant of recent diagnosis of dementia, hypertension, dyslipidemia, some sort of lung problem (patient and family members unaware of the diagnosis; although records indicate bronchiectasis) presented with worsening weakness and dizziness. Patient is a poor historian and and is confused. Reliability of the history is poor. Family members present at bedside don't live the patient. As per the family members, patient was recently diagnosed with dementia few weeks ago and was started on a medication. Patient's appetite has been very poor over the last 2-3 weeks. Patient has become progressively very weak and today she was very weak and dizzy so she was brought to the emergency room. Patient denies any history of recent fall, trauma to head, chest pain,  palpitations, nausea, vomiting, diarrhea, excessive urination or burning urination, fever. Family has noticed some weight loss as well. Mental status apparently was slightly worse today as per the family. No witnessed seizures.  ED Course: Patient's serum sodium was found to be 110; normal saline was ordered and hospitalist service was called to evaluate the patient.   Hospital Course:  Principal Problem:   Hyponatremia Active Problems:   Dementia   HTN (hypertension)   Protein-calorie malnutrition, severe  Acute hyponatremia with hypochloremia - Probably secondary to dehydration and very poor oral intake and diuretic use including hydrochlorothiazide and siadh (from meds lexapro) -  hctz discontinued, she received  3% saline initially, Currently on oral salt tablets and fluids restriction. Avoid SSRI,  nephrology input appreciated.  - sodium normalized.  Dehydration and very poor oral intake - Questionable cause. CAT scan of the chest abdomen pelvis did not show any evidence of malignancy - Dietary evaluation  Mild leukocytosis Probably reactive. Monitor off antibiotics.  wbc normlaized  Hypertension Discontinue diuretics for now. Increase Coreg to 12.5 mg twice a day  Dyslipidemia Continue simvastatin  Dementia   memantine discontinued, she is started on aricept and night time seroquel PT evaluation. Care management consult for home healthneeds. Patient remains intermittently confused probably secondary to delirium on top of dementia. Patient was sleepy on Seroquel 25mg , she is better on reduced does of seroquel at 12.5mg  qhs.  Severe depression with very poor appetite Psychiatric consulted, input appreciated will avoid ssri for now due to hyponatremia  Severe malnutrition Follow nutrition/dietary recommendations  Mild transaminitis with elevated bilirubin Probably from starvation. Resolved   DVT prophylaxis:Lovenox  Code Status:DO NOT  RESUSCITATE Family Communication:dughter at bedside  Disposition Plan:Home with home health  Consultants:Nephrology  Procedures:None  Antimicrobials:None   Discharge Exam: BP (!) 137/45 (BP Location: Left Arm)   Pulse 66   Temp 98.6 F (37 C) (Oral)   Resp 18   Ht 5\' 3"  (1.6 m)   Wt 47.7 kg (105 lb 2.6 oz)   SpO2 100%   BMI 18.63 kg/m   General: much better, she is not oriented to time but to place and person Cardiovascular: RRR Respiratory: CTABL  Discharge Instructions You were cared for by a hospitalist during your hospital stay. If you have any questions about your discharge medications or the care you received while you were in the hospital after you are discharged, you can call the unit and asked to speak with the hospitalist on call if the hospitalist that took care of you is not available. Once you are discharged, your primary care physician will handle any further medical issues. Please note that NO REFILLS for any discharge medications will be authorized once you are discharged, as it is imperative that you return to your primary care physician (or establish a relationship with a primary care physician if you do not have one) for your aftercare needs so that they can reassess your need for medications and monitor your lab values.  Discharge Instructions    Diet general    Complete by:  As directed    Face-to-face encounter (required for Medicare/Medicaid patients)    Complete by:  As directed    I Frederick Klinger certify that this patient is under my care and that I, or a nurse practitioner or physician's assistant working with me, had a face-to-face encounter that meets the physician face-to-face encounter requirements with this patient on 02/11/2017. The encounter with the patient was in whole, or in part for the following medical condition(s) which is the primary reason for home health care (List medical condition): FTT   The encounter with the patient was in whole, or  in part, for the following medical condition, which is the primary reason for home health care:  FTT   I certify that, based on my findings, the following services are medically necessary home health services:   Nursing Physical therapy     Reason for Medically Necessary Home Health Services:  Skilled Nursing- Change/Decline in Patient Status   My clinical findings support the need for the above services:  Cognitive impairments, dementia, or mental confusion  that make it unsafe to leave home   Further, I certify that my clinical findings support that this patient is homebound due to:  Mental confusion   Home Health    Complete by:  As directed    To provide the following care/treatments:   PT [place] work     Increase activity slowly    Complete by:  As directed      Allergies as of 02/12/2017      Reactions   Lexapro [escitalopram] Other (See Comments)   Severe hyponatremia/ SIADH Jun 2018   Thiazide-type Diuretics Other (See Comments)   Severe hyponatremia/ SIADH Jun 2018      Medication List    STOP taking these medications   bisoprolol-hydrochlorothiazide 5-6.25 MG tablet Commonly known as:  ZIAC   escitalopram 10 MG tablet Commonly known as:  LEXAPRO   memantine 5 MG tablet Commonly known as:  NAMENDA     TAKE these medications   acetaminophen 500 MG  tablet Commonly known as:  TYLENOL Take 500 mg by mouth every 6 (six) hours as needed for mild pain.   albuterol 108 (90 Base) MCG/ACT inhaler Commonly known as:  PROVENTIL HFA;VENTOLIN HFA Inhale 2 puffs into the lungs every 6 (six) hours as needed for wheezing or shortness of breath.   ALPRAZolam 0.5 MG tablet Commonly known as:  XANAX 1/2 - 1 TABLET BY MOUTH TWICE A DAY AS NEEDED   carvedilol 12.5 MG tablet Commonly known as:  COREG Take 1 tablet (12.5 mg total) by mouth 2 (two) times daily with a meal. What changed:  medication strength  how much to take   donepezil 5 MG  tablet Commonly known as:  ARICEPT Take 1 tablet (5 mg total) by mouth at bedtime.   feeding supplement Liqd Take 1 Container by mouth 2 (two) times daily between meals.   feeding supplement (ENSURE ENLIVE) Liqd Take 237 mLs by mouth 2 (two) times daily between meals.   fluticasone 50 MCG/ACT nasal spray Commonly known as:  FLONASE Place 2 sprays into both nostrils daily.   multivitamin with minerals Tabs tablet Take 1 tablet by mouth daily. Start taking on:  02/13/2017   polyethylene glycol packet Commonly known as:  MIRALAX / GLYCOLAX Take 17 g by mouth daily. Start taking on:  02/13/2017   QUEtiapine 25 MG tablet Commonly known as:  SEROQUEL Take 0.5 tablets (12.5 mg total) by mouth at bedtime.   senna-docusate 8.6-50 MG tablet Commonly known as:  Senokot-S Take 1 tablet by mouth at bedtime.   simvastatin 40 MG tablet Commonly known as:  ZOCOR Take 40 mg by mouth daily. Once daily   sodium chloride 1 g tablet Take 1 tablet (1 g total) by mouth 2 (two) times daily with a meal.      Allergies  Allergen Reactions  . Lexapro [Escitalopram] Other (See Comments)    Severe hyponatremia/ SIADH Jun 2018  . Thiazide-Type Diuretics Other (See Comments)    Severe hyponatremia/ SIADH Jun 2018   Follow-up Information    Heywood Bene, PA-C Follow up in 1 week(s).   Specialty:  Physician Assistant Why:  hospital discharge follow up, repeat cbc/bmp at follow up. pmd to help transition patient to ALF/memory care. Contact information: [address] [place] [address] [phone]            The results of significant diagnostics from this hospitalization (including imaging, microbiology, ancillary and laboratory) are listed below for reference.    Significant Diagnostic Studies: Ct Chest W Contrast  Result Date: 02/09/2017 CLINICAL DATA:  82 year old female with shortness of breath, abdominal and pelvic pain and unexplained weight loss. EXAM: CT  CHEST, ABDOMEN, AND PELVIS WITH CONTRAST TECHNIQUE: Multidetector CT imaging of the chest, abdomen and pelvis was performed following the standard protocol during bolus administration of intravenous contrast. CONTRAST:  181mL ISOVUE-300 IOPAMIDOL (ISOVUE-300) INJECTION 61% COMPARISON:  04/05/2015 chest CT, 08/03/2007 abdominal CT and other studies FINDINGS: CT CHEST FINDINGS Cardiovascular: Cardiomegaly, coronary artery and thoracic aortic calcifications again noted. There is no evidence of thoracic aortic aneurysm or pericardial effusion. Mediastinum/Nodes: No enlarged mediastinal, hilar, or axillary lymph nodes. Thyroid gland, trachea, and esophagus demonstrate no significant findings. Lungs/Pleura: Cylindrical bronchiectasis within the upper lobes bilaterally noted as well as middle lobe and lingular atelectasis. Mild tree-in-bud opacities within both upper lobes and within the anterior right lower lobe probably represent infection. A stable 8 mm left lower lobe nodule is identified. No airspace disease, consolidation  or mass identified. There is no evidence of pleural effusion or pneumothorax. Musculoskeletal: No chest wall mass or suspicious bone lesions identified. CT ABDOMEN PELVIS FINDINGS Hepatobiliary: The liver and gallbladder are unremarkable. There is no evidence of biliary dilatation. Pancreas: Unremarkable Spleen: Unremarkable Adrenals/Urinary Tract: Moderate to marked bladder distention noted. The kidneys and adrenal glands are unremarkable. Stomach/Bowel: No bowel wall thickening bowel obstruction or inflammatory changes. Mild colonic diverticulosis noted without diverticulitis. Vascular/Lymphatic: Aortic atherosclerosis. No enlarged abdominal or pelvic lymph nodes. Reproductive: Calcified fibroid noted. The adnexal regions are unremarkable Other: No ascites, abscess or pneumoperitoneum. Musculoskeletal: No acute or significant osseous findings. IMPRESSION: Mild tree-in-bud opacities within the  lungs, which has waxed and waned over several studies, and likely represent infection/MAI. Unchanged bilateral upper lobe cylindrical bronchiectasis and middle lobe/lingular atelectasis. Cardiomegaly and coronary artery disease. No evidence of acute abnormality within the abdomen or pelvis. Aortic Atherosclerosis (ICD10-I70.0). Electronically Signed   By: Margarette Canada M.D.   On: 02/09/2017 18:14   Ct Abdomen Pelvis W Contrast  Result Date: 02/09/2017 CLINICAL DATA:  82 year old female with shortness of breath, abdominal and pelvic pain and unexplained weight loss. EXAM: CT CHEST, ABDOMEN, AND PELVIS WITH CONTRAST TECHNIQUE: Multidetector CT imaging of the chest, abdomen and pelvis was performed following the standard protocol during bolus administration of intravenous contrast. CONTRAST:  164mL ISOVUE-300 IOPAMIDOL (ISOVUE-300) INJECTION 61% COMPARISON:  04/05/2015 chest CT, 08/03/2007 abdominal CT and other studies FINDINGS: CT CHEST FINDINGS Cardiovascular: Cardiomegaly, coronary artery and thoracic aortic calcifications again noted. There is no evidence of thoracic aortic aneurysm or pericardial effusion. Mediastinum/Nodes: No enlarged mediastinal, hilar, or axillary lymph nodes. Thyroid gland, trachea, and esophagus demonstrate no significant findings. Lungs/Pleura: Cylindrical bronchiectasis within the upper lobes bilaterally noted as well as middle lobe and lingular atelectasis. Mild tree-in-bud opacities within both upper lobes and within the anterior right lower lobe probably represent infection. A stable 8 mm left lower lobe nodule is identified. No airspace disease, consolidation or mass identified. There is no evidence of pleural effusion or pneumothorax. Musculoskeletal: No chest wall mass or suspicious bone lesions identified. CT ABDOMEN PELVIS FINDINGS Hepatobiliary: The liver and gallbladder are unremarkable. There is no evidence of biliary dilatation. Pancreas: Unremarkable Spleen: Unremarkable  Adrenals/Urinary Tract: Moderate to marked bladder distention noted. The kidneys and adrenal glands are unremarkable. Stomach/Bowel: No bowel wall thickening bowel obstruction or inflammatory changes. Mild colonic diverticulosis noted without diverticulitis. Vascular/Lymphatic: Aortic atherosclerosis. No enlarged abdominal or pelvic lymph nodes. Reproductive: Calcified fibroid noted. The adnexal regions are unremarkable Other: No ascites, abscess or pneumoperitoneum. Musculoskeletal: No acute or significant osseous findings. IMPRESSION: Mild tree-in-bud opacities within the lungs, which has waxed and waned over several studies, and likely represent infection/MAI. Unchanged bilateral upper lobe cylindrical bronchiectasis and middle lobe/lingular atelectasis. Cardiomegaly and coronary artery disease. No evidence of acute abnormality within the abdomen or pelvis. Aortic Atherosclerosis (ICD10-I70.0). Electronically Signed   By: Margarette Canada M.D.   On: 02/09/2017 18:14    Microbiology: Recent Results (from the past 240 hour(s))  MRSA PCR Screening     Status: None   Collection Time: 02/07/17  8:24 PM  Result Value Ref Range Status   MRSA by PCR NEGATIVE NEGATIVE Final    Comment:        The GeneXpert MRSA Assay (FDA approved for NASAL specimens only), is one component of a comprehensive MRSA colonization surveillance program. It is not intended to diagnose MRSA infection nor to guide or monitor treatment for MRSA infections.  Labs: Basic Metabolic Panel:  Recent Labs Lab 02/08/17 1619  02/09/17 0655 02/09/17 1650 02/10/17 0709 02/11/17 0505 02/12/17 0645  NA 116*  < > 124* 121* 124* 132* 136  K 4.1  < > 4.0 3.9 3.6 3.1* 4.7  CL 83*  < > 91* 86* 88* 96* 98*  CO2 25  < > 24 26 28 28 30   GLUCOSE 138*  < > 123* 115* 94 92 101*  BUN 6  < > <5* 7 6 9 10   CREATININE 0.57  < > 0.54 0.61 0.64 0.59 0.67  CALCIUM 8.9  < > 9.0 9.5 9.0 9.0 9.1  MG  --   --   --   --  1.7 2.0 1.9  PHOS  1.6*  --   --   --   --   --   --   < > = values in this interval not displayed. Liver Function Tests:  Recent Labs Lab 02/07/17 1529 02/08/17 1619 02/09/17 0419  AST 59*  --  32  ALT 23  --  19  ALKPHOS 95  --  84  BILITOT 1.7*  --  1.1  PROT 7.1  --  6.5  ALBUMIN 3.9 3.7 3.7   No results for input(s): LIPASE, AMYLASE in the last 168 hours. No results for input(s): AMMONIA in the last 168 hours. CBC:  Recent Labs Lab 02/07/17 1529 02/08/17 0338 02/11/17 0505  WBC 11.5* 11.2* 7.9  NEUTROABS  --   --  4.8  HGB 13.2 13.5 11.3*  HCT 35.6* 36.5 32.5*  MCV 79.5 79.5 84.4  PLT 276 268 219   Cardiac Enzymes: No results for input(s): CKTOTAL, CKMB, CKMBINDEX, TROPONINI in the last 168 hours. BNP: BNP (last 3 results) No results for input(s): BNP in the last 8760 hours.  ProBNP (last 3 results) No results for input(s): PROBNP in the last 8760 hours.  CBG:  Recent Labs Lab 02/07/17 1433  GLUCAP 174*       Signed:  Larin Depaoli MD, PhD  [facility] 02/12/2017, 11:08 AM

## 2017-02-12 NOTE — Progress Notes (Signed)
CSW met with patient daughter at bedside to follow up with her plan for patient at discharge. She reports she has stated looking into ALF-memory care facilities for patient but understands it will be a long process. She reports she has found a friend who does private sitting and will stay with patient during the day and she and pt. son will rotate and stay with the patient during the evening. The physician has ordered for [place] services as well.  No other social service needs identified at this time.   Kathrin Greathouse, Latanya Presser, MSW Clinical Social Worker 5E and Psychiatric Service Line [phone] 02/12/2017  11:19 AM

## 2017-02-26 DIAGNOSIS — E871 Hypo-osmolality and hyponatremia: Secondary | ICD-10-CM | POA: Diagnosis not present

## 2017-02-26 DIAGNOSIS — E7800 Pure hypercholesterolemia, unspecified: Secondary | ICD-10-CM | POA: Diagnosis not present

## 2017-02-26 DIAGNOSIS — E441 Mild protein-calorie malnutrition: Secondary | ICD-10-CM | POA: Diagnosis not present

## 2017-02-26 DIAGNOSIS — F0151 Vascular dementia with behavioral disturbance: Secondary | ICD-10-CM | POA: Diagnosis not present

## 2017-02-26 DIAGNOSIS — R0600 Dyspnea, unspecified: Secondary | ICD-10-CM | POA: Diagnosis not present

## 2017-02-26 DIAGNOSIS — R634 Abnormal weight loss: Secondary | ICD-10-CM | POA: Diagnosis not present

## 2017-03-20 ENCOUNTER — Inpatient Hospital Stay: Admission: RE | Admit: 2017-03-20 | Payer: Self-pay | Source: Ambulatory Visit

## 2017-03-20 ENCOUNTER — Telehealth: Payer: Self-pay | Admitting: Emergency Medicine

## 2017-03-20 NOTE — Telephone Encounter (Signed)
Stacey from [facility] called and stated that the pt no showed for her CT scan.  The pts daughter called and stated that the pt had a CT done in June and felt that the pt did not need this one done so soon.  RB please advise. Thanks

## 2017-03-20 NOTE — Telephone Encounter (Signed)
She is correct. CT scan from 01/2017 was stable.

## 2017-03-23 DIAGNOSIS — E7800 Pure hypercholesterolemia, unspecified: Secondary | ICD-10-CM | POA: Diagnosis not present

## 2017-03-23 DIAGNOSIS — F0151 Vascular dementia with behavioral disturbance: Secondary | ICD-10-CM | POA: Diagnosis not present

## 2017-03-23 DIAGNOSIS — R5383 Other fatigue: Secondary | ICD-10-CM | POA: Diagnosis not present

## 2017-04-15 DIAGNOSIS — F329 Major depressive disorder, single episode, unspecified: Secondary | ICD-10-CM | POA: Diagnosis not present

## 2017-06-15 DIAGNOSIS — F0151 Vascular dementia with behavioral disturbance: Secondary | ICD-10-CM | POA: Diagnosis not present

## 2017-06-15 DIAGNOSIS — F329 Major depressive disorder, single episode, unspecified: Secondary | ICD-10-CM | POA: Diagnosis not present

## 2017-07-07 ENCOUNTER — Encounter (HOSPITAL_COMMUNITY): Payer: Self-pay | Admitting: Emergency Medicine

## 2017-07-07 ENCOUNTER — Emergency Department (HOSPITAL_COMMUNITY)
Admission: EM | Admit: 2017-07-07 | Discharge: 2017-07-07 | Disposition: A | Discharge status: Home / Self Care | Payer: PPO | Attending: Emergency Medicine | Admitting: Emergency Medicine

## 2017-07-07 ENCOUNTER — Emergency Department (HOSPITAL_COMMUNITY): Payer: PPO

## 2017-07-07 DIAGNOSIS — E860 Dehydration: Secondary | ICD-10-CM | POA: Diagnosis not present

## 2017-07-07 DIAGNOSIS — F0390 Unspecified dementia without behavioral disturbance: Secondary | ICD-10-CM | POA: Insufficient documentation

## 2017-07-07 DIAGNOSIS — R55 Syncope and collapse: Secondary | ICD-10-CM | POA: Diagnosis not present

## 2017-07-07 DIAGNOSIS — I10 Essential (primary) hypertension: Secondary | ICD-10-CM | POA: Insufficient documentation

## 2017-07-07 DIAGNOSIS — Z79899 Other long term (current) drug therapy: Secondary | ICD-10-CM | POA: Insufficient documentation

## 2017-07-07 DIAGNOSIS — R197 Diarrhea, unspecified: Secondary | ICD-10-CM | POA: Insufficient documentation

## 2017-07-07 DIAGNOSIS — R0602 Shortness of breath: Secondary | ICD-10-CM | POA: Insufficient documentation

## 2017-07-07 LAB — BASIC METABOLIC PANEL
ANION GAP: 9 (ref 5–15)
BUN: 17 mg/dL (ref 6–20)
CALCIUM: 9.4 mg/dL (ref 8.9–10.3)
CO2: 28 mmol/L (ref 22–32)
CREATININE: 0.91 mg/dL (ref 0.44–1.00)
Chloride: 99 mmol/L — ABNORMAL LOW (ref 101–111)
GFR calc Af Amer: >60 mL/min (ref >60)
GFR calc non Af Amer: 58 mL/min — ABNORMAL LOW (ref >60)
GLUCOSE: 137 mg/dL — AB (ref 65–99)
Potassium: 3.5 mmol/L (ref 3.5–5.1)
Sodium: 136 mmol/L (ref 135–145)

## 2017-07-07 LAB — URINALYSIS, ROUTINE W REFLEX MICROSCOPIC
BILIRUBIN URINE: NEGATIVE
Glucose, UA: NEGATIVE mg/dL
KETONES UR: NEGATIVE mg/dL
Nitrite: NEGATIVE
Protein, ur: 30 mg/dL — AB
SPECIFIC GRAVITY, URINE: 1.021 (ref 1.005–1.030)
pH: 5 (ref 5.0–8.0)

## 2017-07-07 LAB — CBC
HCT: 40.6 % (ref 36.0–46.0)
HEMOGLOBIN: 13.1 g/dL (ref 12.0–15.0)
MCH: 28.8 pg (ref 26.0–34.0)
MCHC: 32.3 g/dL (ref 30.0–36.0)
MCV: 89.2 fL (ref 78.0–100.0)
Platelets: 279 10*3/uL (ref 150–400)
RBC: 4.55 MIL/uL (ref 3.87–5.11)
RDW: 13.3 % (ref 11.5–15.5)
WBC: 11.4 10*3/uL — ABNORMAL HIGH (ref 4.0–10.5)

## 2017-07-07 LAB — TROPONIN I

## 2017-07-07 LAB — CBG MONITORING, ED: GLUCOSE-CAPILLARY: 132 mg/dL — AB (ref 65–99)

## 2017-07-07 MED ORDER — SODIUM CHLORIDE 0.9 % IV BOLUS (SEPSIS)
500.0000 mL | Freq: Once | INTRAVENOUS | Status: AC
  Administered 2017-07-07: 500 mL via INTRAVENOUS

## 2017-07-07 NOTE — ED Notes (Signed)
ED Provider at bedside. 

## 2017-07-07 NOTE — ED Notes (Signed)
Pt unable to urinate at this time for U/A 

## 2017-07-07 NOTE — ED Notes (Signed)
Patient transported to X-ray 

## 2017-07-07 NOTE — ED Triage Notes (Signed)
Patient c/o nausea x2 weeks with syncopal episode and diarrhea this morning. Denies head injury. Hx hypokalemia. Ambulatory. Hx dementia. BIB daughter.

## 2017-07-07 NOTE — ED Notes (Signed)
Pt walks to bathroom with no assistance and tolerates well.

## 2017-07-07 NOTE — Discharge Instructions (Signed)
Please schedule a follow-up appointment with your primary care provider in the next week to follow-up with your visit in the emergency department today.    Try to drink at least 60 fluid ounces of water every day.  You may to drink a little more if you continue to have diarrhea.  The diarrhea should improve on its own in the next few days.  If you develop any new or worsening symptoms, including chest pain, another episode where he passed out, shortness of breath, vomiting and diarrhea, or other new concerning symptoms, please return to the emergency department for reevaluation

## 2017-07-07 NOTE — ED Provider Notes (Signed)
[facility] DEPT Provider Note   CSN: [id number] Arrival date & time: 07/07/17  1334     History   Chief Complaint Chief Complaint  Patient presents with  . Loss of Consciousness    HPI [name] [name] is a 82 y.o. female history of dementia who presents to the emergency department with her daughter with a chief complaint of unwitnessed syncopal episode.  The patient reports that she was ambulating from her bedroom down the hallway to the restroom, and when she returned from the restroom she suddenly felt lightheaded and nauseated and states "I passed out and fell in the floor."  She reports that she knocked the lamp off her nightstand during the fall. She is unsure if she hit her head. She is unsure how long she was down, but reports the clock read 2:00 when she went to the bathroom, and read 2:10 when she climbed back into bed. She states she was able to crawl from where she fell back to her bed. No h/o of similar.   Her daughter reports that the patient was active yesterday and out running errands with her in town.  She reports that she spoke with patient this morning and she said that she needed to sit down because she was feeling slightly winded.  She reports she is complained of mild weakness over the last few weeks.  The patient also states she had 3 episodes of diarrhea over the last 2 days.  The patient denies fever, chills, chest pain, shortness of breath, nausea, vomiting, melena, hematochezia, back pain, dysuria, frequency, hesitancy, or dizziness.  Level 5 caveat secondary to dementia.  The history is provided by the patient and a relative. No language interpreter was used.    Past Medical History:  Diagnosis Date  . Heart murmur   . Hypercholesteremia   . Hypertension     Patient Active Problem List   Diagnosis Date Noted  . Protein-calorie malnutrition, severe 02/09/2017  . Hyponatremia 02/07/2017  . Dementia 02/07/2017  . HTN  (hypertension) 02/07/2017  . Diarrhea 12/03/2015  . Mycobacterium avium infection ([place]) 09/04/2015  . Hemoptysis 04/16/2015  . Bronchiectasis without acute exacerbation ([place]) 03/21/2011  . Chronic cough 02/18/2011  . Rhinitis 02/18/2011  . GERD (gastroesophageal reflux disease) 02/18/2011    Past Surgical History:  Procedure Laterality Date  . VIDEO BRONCHOSCOPY Bilateral 04/16/2015   Procedure: VIDEO BRONCHOSCOPY WITHOUT FLUORO;  Surgeon: Collene Gobble, MD;  Location: [facility];  Service: Cardiopulmonary;  Laterality: Bilateral;    OB History    No data available       Home Medications    Prior to Admission medications   Medication Sig Start Date End Date Taking? Authorizing Provider  acetaminophen (TYLENOL) 500 MG tablet Take 500 mg by mouth every 6 (six) hours as needed for mild pain.     [provider]  albuterol (PROVENTIL HFA;VENTOLIN HFA) 108 (90 Base) MCG/ACT inhaler Inhale 2 puffs into the lungs every 6 (six) hours as needed for wheezing or shortness of breath. Patient not taking: Reported on 02/07/2017 07/07/16   Collene Gobble, MD  ALPRAZolam Duanne Moron) 0.5 MG tablet 1/2 - 1 TABLET BY MOUTH TWICE A DAY AS NEEDED 01/21/17   [provider]  carvedilol (COREG) 12.5 MG tablet Take 1 tablet (12.5 mg total) by mouth 2 (two) times daily with a meal. 02/12/17   Florencia Reasons, MD  donepezil (ARICEPT) 5 MG tablet Take 1 tablet (5 mg total) by mouth  at bedtime. 02/12/17   Florencia Reasons, MD  feeding supplement (BOOST / RESOURCE BREEZE) LIQD Take 1 Container by mouth 2 (two) times daily between meals. 02/12/17   Florencia Reasons, MD  feeding supplement, ENSURE ENLIVE, (ENSURE ENLIVE) LIQD Take 237 mLs by mouth 2 (two) times daily between meals. 02/12/17   Florencia Reasons, MD  fluticasone (FLONASE) 50 MCG/ACT nasal spray Place 2 sprays into both nostrils daily. Patient not taking: Reported on 02/07/2017 09/04/15   Collene Gobble, MD  Multiple Vitamin (MULTIVITAMIN WITH MINERALS) TABS tablet Take  1 tablet by mouth daily. 02/13/17   Florencia Reasons, MD  polyethylene glycol [facility] / Floria Raveling) packet Take 17 g by mouth daily. 02/13/17   Florencia Reasons, MD  QUEtiapine (SEROQUEL) 25 MG tablet Take 0.5 tablets (12.5 mg total) by mouth at bedtime. 02/12/17   Florencia Reasons, MD  senna-docusate (SENOKOT-S) 8.6-50 MG tablet Take 1 tablet by mouth at bedtime. 02/12/17   Florencia Reasons, MD  simvastatin (ZOCOR) 40 MG tablet Take 40 mg by mouth daily. Once daily 12/19/10   [provider]    Family History Family History  Problem Relation Age of Onset  . Emphysema Father   . Brain cancer Brother     Social History Social History   Tobacco Use  . Smoking status: Never Smoker  . Smokeless tobacco: Never Used  Substance Use Topics  . Alcohol use: No  . Drug use: No     Allergies   Lexapro [escitalopram] and Thiazide-type diuretics   Review of Systems Review of Systems  Unable to perform ROS: Dementia  Constitutional: Negative for chills and fever.  HENT: Negative for congestion.   Eyes: Negative for visual disturbance.  Respiratory: Positive for shortness of breath.   Cardiovascular: Negative for chest pain.  Gastrointestinal: Positive for diarrhea and nausea (resolved). Negative for abdominal pain and vomiting.  Endocrine: Negative for polyuria.  Genitourinary: Negative for flank pain.  Musculoskeletal: Negative for arthralgias, back pain and myalgias.  Skin: Negative for wound.  Allergic/Immunologic: Negative for immunocompromised state.  Neurological: Positive for syncope, weakness (generalized) and light-headedness (resolved). Negative for dizziness.  Hematological: Does not bruise/bleed easily.  Psychiatric/Behavioral: Negative for confusion.   Physical Exam Updated Vital Signs BP (!) 157/62 (BP Location: Right Arm)   Pulse (!) 56   Temp (!) 97.4 F (36.3 C) (Oral)   Resp 16   SpO2 99%   Physical Exam  Constitutional: No distress.  Pleasant elderly female who appears frail.  HENT:   Head: Normocephalic and atraumatic.  Eyes: Conjunctivae and EOM are normal. Pupils are equal, round, and reactive to light.  Neck: Neck supple.  Cardiovascular: Normal rate, regular rhythm, normal heart sounds and intact distal pulses. Exam reveals no gallop and no friction rub.  No murmur heard. Pulmonary/Chest: Effort normal and breath sounds normal. No stridor. No respiratory distress. She has no wheezes. She has no rales. She exhibits no tenderness.  Abdominal: Soft. Bowel sounds are normal. She exhibits no distension and no mass. There is no tenderness. There is no rebound and no guarding. No hernia.  Musculoskeletal: Normal range of motion. She exhibits no edema, tenderness or deformity.  Neurological: She is alert.  Normal finger to nose bilaterally.  Cranial Nerves II through XII grossly intact. 5/5 strength against resistance of the large muscle groups of the upper and lower extremities bilaterally. Sensation is equal and intact throughout.  Symmetric tandem gait.  No ataxia.  Skin: Skin is warm. Capillary refill takes less than  2 seconds. No rash noted. She is not diaphoretic.  Psychiatric: Her behavior is normal.  Nursing note and vitals reviewed.    ED Treatments / Results  Labs (all labs ordered are listed, but only abnormal results are displayed) Labs Reviewed  BASIC METABOLIC PANEL - Abnormal; Notable for the following components:      Result Value   Chloride 99 (*)    Glucose, Bld 137 (*)    GFR calc non Af Amer 58 (*)    All other components within normal limits  CBC - Abnormal; Notable for the following components:   WBC 11.4 (*)    All other components within normal limits  URINALYSIS, ROUTINE W REFLEX MICROSCOPIC - Abnormal; Notable for the following components:   APPearance HAZY (*)    Hgb urine dipstick MODERATE (*)    Protein, ur 30 (*)    Leukocytes, UA TRACE (*)    Bacteria, UA RARE (*)    Squamous Epithelial / LPF 0-5 (*)    All other components within  normal limits  CBG MONITORING, ED - Abnormal; Notable for the following components:   Glucose-Capillary 132 (*)    All other components within normal limits  URINE CULTURE  TROPONIN I    EKG  EKG Interpretation  Date/Time:  Tuesday July 07 2017 14:58:48 EST Ventricular Rate:  72 PR Interval:    QRS Duration: 74 QT Interval:  410 QTC Calculation: 449 R Axis:   97 Text Interpretation:  Sinus rhythm Atrial premature complexes Right axis deviation Probable anteroseptal infarct, old Confirmed by Quintella Reichert [id number]) on 07/07/2017 3:19:39 PM       Radiology Dg Chest 2 View  Result Date: 07/07/2017 CLINICAL DATA:  Two weeks of nausea with syncopal episode and diarrhea occurring this morning. History of dementia. Previous history of bronchiectasis, mA I infection, nonsmoker. EXAM: CHEST  2 VIEW COMPARISON:  Chest x-ray of March 22, 2015 and CT scan of the chest of February 09, 2017. FINDINGS: The lungs are hyperinflated with hemidiaphragm flattening. The interstitial markings are coarse bilaterally. There is no alveolar infiltrate. There is no pleural effusion. The heart and pulmonary vascularity are normal. There is calcification in the wall of the aortic arch. There is gentle dextrocurvature of the mid to lower thoracic spine which is stable. IMPRESSION: Chronic bronchitic changes with some fibrosis. No pneumonia, CHF, nor other acute cardiopulmonary abnormality. Thoracic aortic atherosclerosis. Electronically Signed   By: David  Martinique M.D.   On: 07/07/2017 16:30    Procedures Procedures (including critical care time)  Medications Ordered in ED Medications  sodium chloride 0.9 % bolus 500 mL (500 mLs Intravenous New Bag/Given 07/07/17 1722)     Initial Impression / Assessment and Plan / ED Course  I have reviewed the triage vital signs and the nursing notes.  Pertinent labs & imaging results that were available during my care of the patient were reviewed by me and considered  in my medical decision making (see chart for details).     82 year old female with a h/o of dementia who presents to the emergency department with her daughter for chief complaint of unwitnessed syncopal episode.  X-ray with chronic bronchitic changes, but no acute abnormalities.  Negative troponin.  Mild leukocytosis of 11.  No acute changes on EKG including signs of Brugada or Wolff-Parkinson-White.  Electrolytes are unremarkable.  Glucose 137.  Urine with moderate hemoglobin, which has been seen previously on urinalysis.  She also has calcium oxalate stones.  We will send  urine for culture.  Doubt nephrolithiasis, CVA, MI, or pneumonia at this time.  Mild orthostatic hypotension.  Treated the patient with 500 cc bolus.  Patient was seen and evaluated with Dr. Ralene Bathe, attending physician.  Decision making conversation with the patient and the daughter regarding possible admission overnight for observation versus outpatient follow-up with her primary care provider.  The patient's daughter feels that she would like to take the patient home and follow-up outpatient but would bring her back if she develops any new or worsening symptoms.  Strict return precautions given.  No acute distress.  Patient is safe for discharge at this time.  Final Clinical Impressions(s) / ED Diagnoses   Final diagnoses:  Syncope, unspecified syncope type  Dehydration    ED Discharge Orders    None       Joanne Gavel, PA-C 07/07/17 Leonard Downing, MD 07/08/17 [phone]

## 2017-07-07 NOTE — ED Notes (Signed)
Patient transported to CT 

## 2017-07-09 LAB — URINE CULTURE: SPECIAL REQUESTS: NORMAL

## 2017-07-14 ENCOUNTER — Ambulatory Visit: Payer: PPO | Admitting: Emergency Medicine

## 2017-07-14 ENCOUNTER — Encounter: Payer: Self-pay | Admitting: Emergency Medicine

## 2017-07-14 DIAGNOSIS — J471 Bronchiectasis with (acute) exacerbation: Secondary | ICD-10-CM | POA: Diagnosis not present

## 2017-07-14 MED ORDER — LEVOFLOXACIN 500 MG PO TABS: 500.0000 mg | ORAL_TABLET | Freq: Every day | ORAL | 0 refills | Status: DC

## 2017-07-14 NOTE — Progress Notes (Signed)
Subjective:    Patient ID: [name] [name], female    DOB: Nov 01, 1934, 82 y.o.   MRN: [id number]  HPI  ROV 07/07/16 -- this is a follow up visit for pt with a hx bronchiectasis> She is colonized with M avium. We attempted to treat w abx but these were stopped due to side effects. I deferred restarting as her cough had improved. She tells me today that her breathing and cough are doing very well.  No sputum at all. Some clear nasal drainage. Her last CT chest was 03/2015. Able to exert.   ROV 07/14/17 --81 year old woman with a history of hypertension, hyperlipidemia, bronchiectasis with associated cough.  She is colonized with Mycobacterium avium (did not tolerate treatment due to side effects).  She is here today with her daughter who helps with the history.  Unfortunately she has been in the hospital twice since July, once for hyperkalemia (? Due to taking too much BP medication). Last week she had syncope, was treated for dehydration in ED. She notes that she has had increased mucous, green sputum for the last 2 weeks.       Objective:   Physical Exam Vitals:   07/14/17 1333  BP: (!) 144/90  Pulse: 74  SpO2: 100%  Weight: 98 lb (44.5 kg)  Height: _0  (1.6 m)   Gen: Pleasant, thin woman, in no distress,  normal affect  ENT: No lesions,  mouth clear,  oropharynx clear, mild nasal congestion  Neck: No JVD, no TMG, no carotid bruits, no stridor  Lungs: No use of accessory muscles, no dullness to percussion, diminished BS in bases   Cardiovascular: RRR, heart sounds normal, no murmur or gallops, no peripheral edema  Musculoskeletal: No deformities, no cyanosis or clubbing  Neuro: alert, non focal  Skin: Warm, no lesions or rashes    04/04/16 --  COMPARISON:  CT 06/06/2014  FINDINGS: Mediastinum/Nodes: No axillary or supraclavicular lymphadenopathy. No mediastinal hilar adenopathy. Coronary calcifications are present. No pericardial fluid. Esophagus is  normal.  Lungs/Pleura: Again demonstrated bronchial wall thickening and cylindrical and varicose bronchiectasis primarily in the upper lobes. There is atelectasis within the lingula and RIGHT middle lobe similar to prior. Mild micro nodularity associated with this bronchiectasis pattern.  Within the LEFT lower lobe there is increased density of a previously seen ground-glass opacity. This now solid nodule measures 9 mm on image 33, series 3.  RIGHT upper lobe nodule along the fissure measures 6 mm is unchanged.  Upper abdomen: Limited view of the liver, kidneys, pancreas are unremarkable. Normal adrenal glands.  Musculoskeletal: No aggressive osseous lesion.  IMPRESSION: 1. New solid nodule in the LEFT lower lobe at site of prior ground-glass opacity with differential including a focus of infection versus neoplasm. Patient has a history of waxing and waning nodules therefore recommend short-term CT follow-up (1 month) and if lesion persists recommend FDG PET scan or biopsy. 2. Stable bronchiectasis and micro nodularity in the upper lobes most suggestive of chronic infection ( MIA or APBA)).     Assessment & Plan:   Bronchiectasis with acute exacerbation ([place]) She appears to have an acute flare.  Her CT scan done in June she did show some progression of her bronchiectatic change likely due to untreated Mycobacterium avium.  I would prefer to treat her for the acute flare with Levaquin, assess her improvement.  We may be able to defer long-term therapy for MAC.     Baltazar Apo, MD, PhD 07/14/2017, 1:51 PM [facility] [phone] or if no answer [phone]

## 2017-07-14 NOTE — Patient Instructions (Signed)
Please take Levaquin 500 mg daily for the next 10 days Call our office if you are cough and mucus are not improving We will hold off on starting long-term antibiotics for now. Follow with Dr Lamonte Sakai in 2 months or sooner if you have any problems.

## 2017-07-14 NOTE — Assessment & Plan Note (Signed)
She appears to have an acute flare.  Her CT scan done in June she did show some progression of her bronchiectatic change likely due to untreated Mycobacterium avium.  I would prefer to treat her for the acute flare with Levaquin, assess her improvement.  We may be able to defer long-term therapy for MAC.

## 2017-07-14 NOTE — Addendum Note (Signed)
Addended by: Desmond Dike C on: 07/14/2017 01:52 PM   Modules accepted: Orders

## 2017-08-05 ENCOUNTER — Other Ambulatory Visit: Payer: Self-pay | Admitting: Physician Assistant

## 2017-08-05 ENCOUNTER — Ambulatory Visit
Admission: RE | Admit: 2017-08-05 | Discharge: 2017-08-05 | Disposition: A | Discharge status: Home / Self Care | Payer: PPO | Source: Ambulatory Visit | Attending: Physician Assistant | Admitting: Physician Assistant

## 2017-08-05 DIAGNOSIS — F329 Major depressive disorder, single episode, unspecified: Secondary | ICD-10-CM | POA: Diagnosis not present

## 2017-08-05 DIAGNOSIS — R002 Palpitations: Secondary | ICD-10-CM | POA: Diagnosis not present

## 2017-08-05 DIAGNOSIS — E7800 Pure hypercholesterolemia, unspecified: Secondary | ICD-10-CM | POA: Diagnosis not present

## 2017-08-05 DIAGNOSIS — R0789 Other chest pain: Secondary | ICD-10-CM

## 2017-08-05 DIAGNOSIS — R05 Cough: Secondary | ICD-10-CM | POA: Diagnosis not present

## 2017-08-06 ENCOUNTER — Telehealth: Payer: Self-pay

## 2017-08-06 DIAGNOSIS — I493 Ventricular premature depolarization: Secondary | ICD-10-CM | POA: Diagnosis not present

## 2017-08-06 NOTE — Telephone Encounter (Signed)
SENT NOTES TO SCHEDULING 

## 2017-08-07 ENCOUNTER — Other Ambulatory Visit: Payer: Self-pay | Admitting: Physician Assistant

## 2017-08-07 DIAGNOSIS — R0789 Other chest pain: Secondary | ICD-10-CM

## 2017-08-07 DIAGNOSIS — R7989 Other specified abnormal findings of blood chemistry: Secondary | ICD-10-CM

## 2017-08-13 ENCOUNTER — Inpatient Hospital Stay
Admission: RE | Admit: 2017-08-13 | Discharge: 2017-08-13 | Disposition: A | Discharge status: Home / Self Care | Payer: Self-pay | Source: Ambulatory Visit | Attending: Physician Assistant | Admitting: Physician Assistant

## 2017-08-13 ENCOUNTER — Ambulatory Visit
Admission: RE | Admit: 2017-08-13 | Discharge: 2017-08-13 | Disposition: A | Discharge status: Home / Self Care | Payer: Self-pay | Source: Ambulatory Visit | Attending: Physician Assistant | Admitting: Physician Assistant

## 2017-08-13 DIAGNOSIS — R0789 Other chest pain: Secondary | ICD-10-CM | POA: Diagnosis not present

## 2017-08-13 DIAGNOSIS — R7989 Other specified abnormal findings of blood chemistry: Secondary | ICD-10-CM

## 2017-08-13 MED ORDER — IOPAMIDOL (ISOVUE-370) INJECTION 76%
75.0000 mL | Freq: Once | INTRAVENOUS | Status: AC | PRN
  Administered 2017-08-13: 75 mL via INTRAVENOUS

## 2017-08-31 ENCOUNTER — Emergency Department (HOSPITAL_COMMUNITY)
Admission: EM | Admit: 2017-08-31 | Discharge: 2017-08-31 | Disposition: A | Discharge status: Home / Self Care | Payer: PPO | Attending: Emergency Medicine | Admitting: Emergency Medicine

## 2017-08-31 ENCOUNTER — Emergency Department (HOSPITAL_COMMUNITY): Payer: PPO

## 2017-08-31 ENCOUNTER — Encounter (HOSPITAL_COMMUNITY): Payer: Self-pay | Admitting: Family Medicine

## 2017-08-31 DIAGNOSIS — R079 Chest pain, unspecified: Secondary | ICD-10-CM | POA: Insufficient documentation

## 2017-08-31 DIAGNOSIS — Z7982 Long term (current) use of aspirin: Secondary | ICD-10-CM | POA: Insufficient documentation

## 2017-08-31 DIAGNOSIS — Z79899 Other long term (current) drug therapy: Secondary | ICD-10-CM | POA: Diagnosis not present

## 2017-08-31 DIAGNOSIS — R0789 Other chest pain: Secondary | ICD-10-CM | POA: Diagnosis not present

## 2017-08-31 DIAGNOSIS — I10 Essential (primary) hypertension: Secondary | ICD-10-CM | POA: Diagnosis not present

## 2017-08-31 DIAGNOSIS — F0390 Unspecified dementia without behavioral disturbance: Secondary | ICD-10-CM | POA: Diagnosis not present

## 2017-08-31 DIAGNOSIS — R002 Palpitations: Secondary | ICD-10-CM

## 2017-08-31 LAB — BASIC METABOLIC PANEL
Anion gap: 6 (ref 5–15)
BUN: 9 mg/dL (ref 6–20)
CHLORIDE: 105 mmol/L (ref 101–111)
CO2: 28 mmol/L (ref 22–32)
CREATININE: 0.66 mg/dL (ref 0.44–1.00)
Calcium: 9.2 mg/dL (ref 8.9–10.3)
GFR calc Af Amer: >60 mL/min (ref >60)
GFR calc non Af Amer: >60 mL/min (ref >60)
Glucose, Bld: 113 mg/dL — ABNORMAL HIGH (ref 65–99)
POTASSIUM: 3.9 mmol/L (ref 3.5–5.1)
Sodium: 139 mmol/L (ref 135–145)

## 2017-08-31 LAB — CBC
HEMATOCRIT: 39.3 % (ref 36.0–46.0)
Hemoglobin: 12.7 g/dL (ref 12.0–15.0)
MCH: 29.3 pg (ref 26.0–34.0)
MCHC: 32.3 g/dL (ref 30.0–36.0)
MCV: 90.8 fL (ref 78.0–100.0)
PLATELETS: 269 10*3/uL (ref 150–400)
RBC: 4.33 MIL/uL (ref 3.87–5.11)
RDW: 13.7 % (ref 11.5–15.5)
WBC: 9.6 10*3/uL (ref 4.0–10.5)

## 2017-08-31 LAB — I-STAT TROPONIN, ED
Troponin i, poc: 0 ng/mL (ref 0.00–0.08)
Troponin i, poc: 0 ng/mL (ref 0.00–0.08)

## 2017-08-31 MED ORDER — ONDANSETRON 4 MG PO TBDP
4.0000 mg | ORAL_TABLET | Freq: Once | ORAL | Status: AC | PRN
  Administered 2017-08-31: 4 mg via ORAL
  Filled 2017-08-31: qty 1

## 2017-08-31 NOTE — ED Provider Notes (Signed)
[facility] DEPT Provider Note   CSN: [id number] Arrival date & time: 08/31/17  1527     History   Chief Complaint Chief Complaint  Patient presents with  . Chest Pain    HPI [name] [name] is a 83 y.o. female.  HPI Level 5 caveat secondary to dementia.  83 year old female brought in by her daughter for complaints of palpitations rapid heart rate and some chest discomfort that has been on and off for 2 days.  She describes it as a tightness.  Currently she is not feeling any rapid heart rate or any chest pain.  The daughter states she has had these symptoms before.  The last time this happened was about a month ago when she was diagnosed with pneumonia.  Currently she has no cough no fever no abdominal pain no vomiting or diarrhea.  She has had a fair appetite per her daughter. Past Medical History:  Diagnosis Date  . Heart murmur   . Hypercholesteremia   . Hypertension     Patient Active Problem List   Diagnosis Date Noted  . Protein-calorie malnutrition, severe 02/09/2017  . Hyponatremia 02/07/2017  . Dementia 02/07/2017  . HTN (hypertension) 02/07/2017  . Diarrhea 12/03/2015  . Mycobacterium avium infection ([place]) 09/04/2015  . Hemoptysis 04/16/2015  . Bronchiectasis with acute exacerbation ([place]) 12/29/2014  . Bronchiectasis without acute exacerbation ([place]) 03/21/2011  . Chronic cough 02/18/2011  . Rhinitis 02/18/2011  . GERD (gastroesophageal reflux disease) 02/18/2011    Past Surgical History:  Procedure Laterality Date  . VIDEO BRONCHOSCOPY Bilateral 04/16/2015   Procedure: VIDEO BRONCHOSCOPY WITHOUT FLUORO;  Surgeon: Collene Gobble, MD;  Location: [facility];  Service: Cardiopulmonary;  Laterality: Bilateral;    OB History    No data available       Home Medications    Prior to Admission medications   Medication Sig Start Date End Date Taking? Authorizing Provider  acetaminophen (TYLENOL) 500 MG tablet Take 500 mg by  mouth every 6 (six) hours as needed for mild pain.    Yes [provider]  ALPRAZolam (XANAX) 0.5 MG tablet 1/2 - 1 TABLET BY MOUTH TWICE A DAY AS NEEDED 01/21/17  Yes [provider]  aspirin 81 MG chewable tablet Chew 81 mg by mouth daily.   Yes [provider]  carvedilol (COREG) 12.5 MG tablet Take 1 tablet (12.5 mg total) by mouth 2 (two) times daily with a meal. 02/12/17  Yes Florencia Reasons, MD  donepezil (ARICEPT) 5 MG tablet Take 1 tablet (5 mg total) by mouth at bedtime. 02/12/17  Yes Florencia Reasons, MD  simvastatin (ZOCOR) 40 MG tablet Take 40 mg by mouth daily. Once daily 12/19/10  Yes [provider]  albuterol (PROVENTIL HFA;VENTOLIN HFA) 108 (90 Base) MCG/ACT inhaler Inhale 2 puffs into the lungs every 6 (six) hours as needed for wheezing or shortness of breath. Patient not taking: Reported on 08/31/2017 07/07/16   Collene Gobble, MD  feeding supplement (BOOST / RESOURCE BREEZE) LIQD Take 1 Container by mouth 2 (two) times daily between meals. 02/12/17   Florencia Reasons, MD  feeding supplement, ENSURE ENLIVE, (ENSURE ENLIVE) LIQD Take 237 mLs by mouth 2 (two) times daily between meals. 02/12/17   Florencia Reasons, MD  fluticasone (FLONASE) 50 MCG/ACT nasal spray Place 2 sprays into both nostrils daily. 09/04/15   Collene Gobble, MD  levofloxacin (LEVAQUIN) 500 MG tablet Take 1 tablet (500 mg total) by mouth daily. 07/14/17   Byrum,  Rose Fillers, MD  Multiple Vitamin (MULTIVITAMIN WITH MINERALS) TABS tablet Take 1 tablet by mouth daily. 02/13/17   Florencia Reasons, MD  polyethylene glycol [facility] / Floria Raveling) packet Take 17 g by mouth daily. 02/13/17   Florencia Reasons, MD  QUEtiapine (SEROQUEL) 25 MG tablet Take 0.5 tablets (12.5 mg total) by mouth at bedtime. 02/12/17   Florencia Reasons, MD  senna-docusate (SENOKOT-S) 8.6-50 MG tablet Take 1 tablet by mouth at bedtime. 02/12/17   Florencia Reasons, MD    Family History Family History  Problem Relation Age of Onset  . Emphysema Father   . Brain cancer Brother      Social History Social History   Tobacco Use  . Smoking status: Never Smoker  . Smokeless tobacco: Never Used  Substance Use Topics  . Alcohol use: No  . Drug use: No     Allergies   Lexapro [escitalopram] and Thiazide-type diuretics   Review of Systems Review of Systems  Constitutional: Negative for chills and fever.  HENT: Negative for ear pain and sore throat.   Eyes: Negative for pain and visual disturbance.  Respiratory: Negative for cough and shortness of breath.   Cardiovascular: Positive for chest pain and palpitations.  Gastrointestinal: Negative for abdominal pain and vomiting.  Genitourinary: Negative for dysuria and hematuria.  Musculoskeletal: Negative for arthralgias and back pain.  Skin: Negative for color change and rash.  Neurological: Negative for seizures and syncope.  Psychiatric/Behavioral: Positive for confusion.  All other systems reviewed and are negative.    Physical Exam Updated Vital Signs BP (!) 201/77 (BP Location: Left Arm)   Pulse 83   Temp 98.1 F (36.7 C) (Oral)   Resp 14   Ht 5\' 3"  (1.6 m)   Wt 44.5 kg (98 lb)   SpO2 94%   BMI 17.36 kg/m   Physical Exam  Constitutional: She appears well-developed and well-nourished. No distress.  HENT:  Head: Normocephalic and atraumatic.  Eyes: Conjunctivae are normal.  Neck: Neck supple.  Cardiovascular: Normal rate, regular rhythm and normal pulses.  No murmur heard. Pulmonary/Chest: Effort normal and breath sounds normal. No respiratory distress.  Abdominal: Soft. There is no tenderness.  Musculoskeletal: Normal range of motion. She exhibits no edema or tenderness.       Right lower leg: Normal. She exhibits no edema.       Left lower leg: Normal. She exhibits no edema.  Neurological: She is alert.  Skin: Skin is warm and dry.  Psychiatric: She has a normal mood and affect.  Nursing note and vitals reviewed.    ED Treatments / Results  Labs (all labs ordered are listed, but  only abnormal results are displayed) Labs Reviewed  BASIC METABOLIC PANEL - Abnormal; Notable for the following components:      Result Value   Glucose, Bld 113 (*)    All other components within normal limits  CBC  I-STAT TROPONIN, ED    EKG  EKG Interpretation  Date/Time:  Monday August 31 2017 15:37:03 EST Ventricular Rate:  66 PR Interval:    QRS Duration: 96 QT Interval:  407 QTC Calculation: 427 R Axis:   99 Text Interpretation:  Sinus rhythm Supraventricular bigeminy Short PR interval Lateral infarct, old no acute st/ts Confirmed by Aletta Edouard [phone]) on 08/31/2017 5:53:18 PM       Radiology Dg Chest 2 View  Result Date: 08/31/2017 CLINICAL DATA:  Patient with chest pain. EXAM: CHEST  2 VIEW COMPARISON:  Chest CT 08/13/2017. FINDINGS: Monitoring  leads overlie the patient. Stable cardiomegaly. Read demonstrated patchy bilateral airspace opacities. No pleural effusion or pneumothorax. Thoracic spine degenerative changes. IMPRESSION: Read demonstrated patchy bilateral airspace opacities which may be infectious in etiology. As demonstrated on prior chest CT, malignancy is not excluded at additional locations. Recommend follow-up chest CT in 3 months. Electronically Signed   By: Lovey Newcomer M.D.   On: 08/31/2017 16:32    Procedures Procedures (including critical care time)  Medications Ordered in ED Medications  ondansetron (ZOFRAN-ODT) disintegrating tablet 4 mg (4 mg Oral Given 08/31/17 1557)     Initial Impression / Assessment and Plan / ED Course  I have reviewed the triage vital signs and the nursing notes.  Pertinent labs & imaging results that were available during my care of the patient were reviewed by me and considered in my medical decision making (see chart for details).  Clinical Course as of Sep 01 1813  Mon Aug 31, 8332  [id number] 83 year old female with no prior history of cardiac disease here with chest pain and palpitations.  Her initial troponin is  negative.  [MB]    Clinical Course User Index [MB] Hayden Rasmussen, MD    83 year old female poor historian secondary to dementia here with chest pain.  Per the daughter the patient does not usually complain of chest pain but was recently diagnosed with pneumonia.  Differential diagnosis includes myocardial ischemia, pneumothorax, pneumonia.  Here she had 2 troponins that were negative with a nonischemic appearing EKG.  Her chest x-ray demonstrated no acute worsening from prior imaging.  The daughter was comfortable taking the patient home and she will follow-up with her primary care doctor.  They are both explained indications for return to hospital if any worsening conditions.  Final Clinical Impressions(s) / ED Diagnoses   Final diagnoses:  Chest pain, unspecified type  Palpitations    ED Discharge Orders    None       Hayden Rasmussen, MD 09/01/17 [phone]

## 2017-08-31 NOTE — Discharge Instructions (Signed)
Your evaluated in the emergency department for chest pain and palpitations.  Your testing did not reveal any signs of injury to the heart.  You are being discharged and will need to follow-up with her primary care doctor.  Please continue regular medicines.  You should return to the hospital if any worsening of your symptoms.

## 2017-08-31 NOTE — ED Triage Notes (Signed)
Patient is complaining of intermittent chest pain that started yesterday. She reports the pain is generalized in her chest with no radiation. Describes pain as tightness and feels palpitations at times. Other associated symptoms are lightheadedness, nausea, and generalized weakness. Denies shortness of breath.

## 2017-08-31 NOTE — ED Notes (Signed)
Bed: VK18 Expected date:  Expected time:  Means of arrival:  Comments: Chest pain triage 1

## 2017-09-02 DIAGNOSIS — R05 Cough: Secondary | ICD-10-CM | POA: Diagnosis not present

## 2017-09-03 ENCOUNTER — Encounter: Payer: Self-pay | Admitting: Acute Care

## 2017-09-03 ENCOUNTER — Ambulatory Visit: Payer: PPO | Admitting: Acute Care

## 2017-09-03 VITALS — BP 128/82 | HR 55 | Ht 63.0 in | Wt 96.2 lb

## 2017-09-03 DIAGNOSIS — J471 Bronchiectasis with (acute) exacerbation: Secondary | ICD-10-CM

## 2017-09-03 DIAGNOSIS — J479 Bronchiectasis, uncomplicated: Secondary | ICD-10-CM

## 2017-09-03 MED ORDER — FLUTTER DEVI: 0 refills | Status: DC

## 2017-09-03 MED ORDER — FLUTTER DEVI: 0 refills | Status: AC

## 2017-09-03 MED ORDER — LEVOFLOXACIN 500 MG PO TABS: 500.0000 mg | ORAL_TABLET | Freq: Every day | ORAL | 0 refills | Status: DC

## 2017-09-03 NOTE — Patient Instructions (Addendum)
It is nice to see you today. Flutter valve 4 puffs 4-5 times daily. Levaquin 500 mg once daily x 7 days Probiotic while on antibiotic Flutter valve 4 -5 puffs 4 times a day. Continue Mucinex as you have been doing We will give you a specimen container for sputum collection 1 month after antibiotic is complete. We will place an order for sputum culture.( AFB, Culture and Fungus) Follow up appointment with Sarah or Dr. Lamonte Sakai in 3 weeks. Follow up CT chest in 3 months. Please contact office for sooner follow up if symptoms do not improve or worsen or seek emergency care

## 2017-09-03 NOTE — Progress Notes (Signed)
History of Present Illness [name] [name] is a 83 y.o. female never smoker with history of hypertension, hyperlipidemia, bronchiectasis with associated cough.  She is colonized with Mycobacterium avium (did not tolerate treatment due to side effects). She is followed by Dr. Lamonte Sakai.   09/03/2017 Hospital Follow Up: Pt. Presents after recent Ed visit 08/31/2017 for palpitations. Per the ED note:  83 year old female poor historian secondary to dementia here with chest pain.  Per the daughter the patient does not usually complain of chest pain but was recently diagnosed with pneumonia.  Differential diagnosis includes myocardial ischemia, pneumothorax, pneumonia.  Here she had 2 troponins that were negative with a nonischemic appearing EKG.  Her chest x-ray demonstrated no acute worsening from prior imaging.  The daughter was comfortable taking the patient home and she will follow-up with her primary care doctor.  They are both explained indications for return to hospital if any worsening conditions.  Pt was most recently seen by Dr. Lamonte Sakai with an acute  flare of her bronchiectasis on 07/14/2017. She was treated at that time with Levaquin with plans to re-assess her and hopefully defer long term treatment for MAC. Pt. Presents today for follow up. Most of history is per the patient's daughter who is with the patient. She states she is still feeling poorly. She was seen and discharged through the ED 08/31/2017. ( See above) She states she is coughing up sputum that is primarily yellow in color but thin. She states most sputum production is in the morning. She denies any fever, chest pain orthopnea or hemoptysis. I have noted CXR results from 1/14/ED visit.   Test Results:   IMPRESSION: CXR 08/31/2017>> IMPRESSION: Read demonstrated patchy bilateral airspace opacities which may be infectious in etiology. As demonstrated on prior chest CT, malignancy is not excluded at additional locations.  Recommend follow-up chest CT in 3 months.  08/13/2017>> CTA No evidence of acute pulmonary thromboembolism. Tree in bud nodules have worsened suggesting mycobacterium infection or viral pneumonia. There are indeterminate pulmonary opacities bilaterally. Some have the appearance of airspace disease while others are masslike. Malignancy cannot be excluded. If the patient is immunocompromised, consider opportunistic infection. The largest mass is 1.1 cm in the right lower lobe. Initial follow-up by chest CT without contrast is recommended in 3 months to confirm persistence.   CT Chest 02/02/2017 IMPRESSION: Mild tree-in-bud opacities within the lungs, which has waxed and waned over several studies, and likely represent infection/MAI. Unchanged bilateral upper lobe cylindrical bronchiectasis and middle lobe/lingular atelectasis. Cardiomegaly and coronary artery disease.   CBC Latest Ref Rng & Units 08/31/2017 07/07/2017 02/11/2017  WBC 4.0 - 10.5 K/uL 9.6 11.4(H) 7.9  Hemoglobin 12.0 - 15.0 g/dL 12.7 13.1 11.3(L)  Hematocrit 36.0 - 46.0 % 39.3 40.6 32.5(L)  Platelets 150 - 400 K/uL 269 279 219    BMP Latest Ref Rng & Units 08/31/2017 07/07/2017 02/12/2017  Glucose 65 - 99 mg/dL 113(H) 137(H) 101(H)  BUN 6 - 20 mg/dL _0 Creatinine 0.44 - 1.00 mg/dL 0.66 0.91 0.67  Sodium 135 - 145 mmol/L 139 136 136  Potassium 3.5 - 5.1 mmol/L 3.9 3.5 4.7  Chloride 101 - 111 mmol/L 105 99(L) 98(L)  CO2 22 - 32 mmol/L _1 Calcium 8.9 - 10.3 mg/dL 9.2 9.4 9.1    PFT    Component Value Date/Time   FEV1PRE 1.56 05/10/2015 1241   FEV1POST 1.52 05/10/2015 1241   FVCPRE 2.17 05/10/2015 1241   FVCPOST 2.11  05/10/2015 1241   TLC 4.57 05/10/2015 1241   DLCOUNC 16.05 05/10/2015 1241   PREFEV1FVCRT 72 05/10/2015 1241   PSTFEV1FVCRT 72 05/10/2015 1241    Dg Chest 2 View  Result Date: 08/31/2017 CLINICAL DATA:  Patient with chest pain. EXAM: CHEST  2 VIEW COMPARISON:  Chest CT  08/13/2017. FINDINGS: Monitoring leads overlie the patient. Stable cardiomegaly. Read demonstrated patchy bilateral airspace opacities. No pleural effusion or pneumothorax. Thoracic spine degenerative changes. IMPRESSION: Read demonstrated patchy bilateral airspace opacities which may be infectious in etiology. As demonstrated on prior chest CT, malignancy is not excluded at additional locations. Recommend follow-up chest CT in 3 months. Electronically Signed   By: Lovey Newcomer M.D.   On: 08/31/2017 16:32   Dg Chest 2 View  Result Date: 08/06/2017 CLINICAL DATA:  Two weeks of chest pressure, productive cough. History of chronic bronchitis and bronchiectasis. EXAM: CHEST  2 VIEW COMPARISON:  PA and lateral chest x-ray of July 07, 2017 FINDINGS: The lungs remain hyperinflated. The interstitial markings remain increased and are slightly more conspicuous today. Confluent alveolar opacity in the left mid lung and in the right infrahilar region is slightly more conspicuous. The heart is top-normal in size. The pulmonary vascularity is not clearly engorged. There is calcification in the wall of the aortic arch. No mediastinal or hilar lymphadenopathy is observed. The bony thorax is unremarkable. IMPRESSION: Chronic bronchitic-bronchiectatic changes. Patchy areas of alveolar opacity today are more conspicuous and may reflect superimposed acute pneumonia. Followup PA and lateral chest X-ray is recommended in 3-4 weeks following trial of antibiotic therapy to ensure resolution and exclude underlying malignancy. Electronically Signed   By: David  Martinique M.D.   On: 08/06/2017 07:53   Ct Angio Chest Pe W Or Wo Contrast  Result Date: 08/13/2017 CLINICAL DATA:  Chest pressure and productive cough EXAM: CT ANGIOGRAPHY CHEST WITH CONTRAST TECHNIQUE: Multidetector CT imaging of the chest was performed using the standard protocol during bolus administration of intravenous contrast. Multiplanar CT image reconstructions and  MIPs were obtained to evaluate the vascular anatomy. CONTRAST:  85m ISOVUE-370 IOPAMIDOL (ISOVUE-370) INJECTION 76% COMPARISON:  02/09/2017 FINDINGS: Cardiovascular: There are no filling defects in the pulmonary arterial tree to suggest acute pulmonary thromboembolism. Mild 3 vessel coronary artery calcifications. Atherosclerotic aortic calcifications are noted. Mediastinum/Nodes: There is no abnormal mediastinal or hilar adenopathy. No pericardial effusion. Thyroid is atrophic. Lungs/Pleura: Tree in bud nodules in both upper lobes as well as a superior segment of the lower lobes has increased. Bronchial wall thickening throughout the lungs most prominent in the right upper lobe is present. There are some indeterminate pulmonary opacities scattered throughout both lungs. Some of these have the appearance of airspace disease while others are masslike. There is a 1.1 cm mass in the right lower lobe on image 80. 1.0 cm mass in the superior segment of the left lower lobe on image 50. No pneumothorax. No pleural effusion. There is also subsegmental atelectasis in the right middle lobe and lingula. See image 66 of series 5. Upper Abdomen: No acute abnormality. Musculoskeletal: No vertebral compression deformity. Review of the MIP images confirms the above findings. IMPRESSION: No evidence of acute pulmonary thromboembolism. Tree in bud nodules have worsened suggesting mycobacterium infection or viral pneumonia. There are indeterminate pulmonary opacities bilaterally. Some have the appearance of airspace disease while others are masslike. Malignancy cannot be excluded. If the patient is immunocompromised, consider opportunistic infection. The largest mass is 1.1 cm in the right lower lobe. Initial follow-up by chest  CT without contrast is recommended in 3 months to confirm persistence. This recommendation follows the consensus statement: Recommendations for the Management of Subsolid Pulmonary Nodules Detected at CT: A  Statement from the [place] as published in Radiology 2013; 266:304-317. Aortic Atherosclerosis (ICD10-I70.0). Electronically Signed   By: Marybelle Killings M.D.   On: 08/13/2017 16:32     Past medical hx Past Medical History:  Diagnosis Date  . Heart murmur   . Hypercholesteremia   . Hypertension      Social History   Tobacco Use  . Smoking status: Never Smoker  . Smokeless tobacco: Never Used  Substance Use Topics  . Alcohol use: No  . Drug use: No    Ms.Ishikawa reports that  has never smoked. she has never used smokeless tobacco. She reports that she does not drink alcohol or use drugs.  Tobacco Cessation: Never Smoker  Past surgical hx, Family hx, Social hx all reviewed.  Current Outpatient Medications on File Prior to Visit  Medication Sig  . acetaminophen (TYLENOL) 500 MG tablet Take 500 mg by mouth every 6 (six) hours as needed for mild pain.   Marland Kitchen albuterol (PROVENTIL HFA;VENTOLIN HFA) 108 (90 Base) MCG/ACT inhaler Inhale 2 puffs into the lungs every 6 (six) hours as needed for wheezing or shortness of breath.  . ALPRAZolam (XANAX) 0.5 MG tablet 1/2 - 1 TABLET BY MOUTH TWICE A DAY AS NEEDED  . aspirin 81 MG chewable tablet Chew 81 mg by mouth daily.  . carvedilol (COREG) 12.5 MG tablet Take 1 tablet (12.5 mg total) by mouth 2 (two) times daily with a meal.  . donepezil (ARICEPT) 5 MG tablet Take 1 tablet (5 mg total) by mouth at bedtime.  . feeding supplement (BOOST / RESOURCE BREEZE) LIQD Take 1 Container by mouth 2 (two) times daily between meals.  . feeding supplement, ENSURE ENLIVE, (ENSURE ENLIVE) LIQD Take 237 mLs by mouth 2 (two) times daily between meals.  . fluticasone (FLONASE) 50 MCG/ACT nasal spray Place 2 sprays into both nostrils daily.  . Multiple Vitamin (MULTIVITAMIN WITH MINERALS) TABS tablet Take 1 tablet by mouth daily.  . polyethylene glycol (MIRALAX / GLYCOLAX) packet Take 17 g by mouth daily.  . QUEtiapine (SEROQUEL) 25 MG tablet Take 0.5  tablets (12.5 mg total) by mouth at bedtime.  . senna-docusate (SENOKOT-S) 8.6-50 MG tablet Take 1 tablet by mouth at bedtime.  . simvastatin (ZOCOR) 40 MG tablet Take 40 mg by mouth daily. Once daily   No current facility-administered medications on file prior to visit.      Allergies  Allergen Reactions  . Lexapro [Escitalopram] Other (See Comments)    Severe hyponatremia/ SIADH Jun 2018  . Thiazide-Type Diuretics Other (See Comments)    Severe hyponatremia/ SIADH Jun 2018    Review Of Systems:  Constitutional:   No  weight loss, night sweats,  Fevers, chills,+  fatigue, or  lassitude.  HEENT:   No headaches,  Difficulty swallowing,  Tooth/dental problems, or  Sore throat,                No sneezing, itching, ear ache, nasal congestion, post nasal drip,   CV:  No chest pain,  Orthopnea, PND, swelling in lower extremities, anasarca, dizziness, palpitations, syncope.   GI  No heartburn, indigestion, abdominal pain, nausea, vomiting, diarrhea, change in bowel habits, loss of appetite, bloody stools.   Resp: No shortness of breath with exertion or at rest.  + baseline  excess mucus, + baseline  productive cough,  No non-productive cough,  No coughing up of blood.  No change in color of mucus.  No wheezing.  No chest wall deformity  Skin: no rash or lesions,.  GU: no dysuria, change in color of urine, no urgency or frequency.  No flank pain, no hematuria   MS:  No joint pain or swelling.  No decreased range of motion.  No back pain.  Psych:  No change in mood or affect. No depression or anxiety.  No memory loss.   Vital Signs BP 128/82 (BP Location: Right Arm, Cuff Size: Normal)   Pulse (!) 55   Ht _0  (1.6 m)   Wt 96 lb 3.2 oz (43.6 kg)   SpO2 95%   BMI 17.04 kg/m    Physical Exam:  General- No distress,  A&Ox3, pleasant slightly confused elderly female ENT: No sinus tenderness, TM clear, pale nasal mucosa, no oral exudate,no post nasal drip, no LAN Cardiac: S1, S2,  regular rate and rhythm, no murmur Chest: No wheeze/ rales/ dullness; no accessory muscle use, no nasal flaring, no sternal retractions, diminished per bases Abd.: Soft Non-tender, non-distended Ext: No clubbing cyanosis, edema Neuro:  Deconditioned at baseline Skin: No rashes, warm and dry, thin and frail, intact Psych: normal mood and behavior   Assessment/Plan  Bronchiectasis with acute exacerbation (HCC) Flare Plan: Flutter valve 4 puffs 4-5 times daily. Levaquin 500 mg once daily x 7 days Probiotic while on antibiotic Flutter valve 4 -5 puffs 4 times a day. Continue Mucinex as you have been doing We will give you a specimen container for sputum collection 1 month after antibiotic is complete. We will place an order for sputum culture.( AFB, Culture and Fungus) Follow up appointment with Nurah Petrides or Dr. Lamonte Sakai in 3 weeks. Follow up CT chest in 3 months. Please contact office for sooner follow up if symptoms do not improve or worsen or seek emergency care        Magdalen Spatz, NP 09/03/2017  3:50 PM

## 2017-09-03 NOTE — Assessment & Plan Note (Signed)
Flare Plan: Flutter valve 4 puffs 4-5 times daily. Levaquin 500 mg once daily x 7 days Probiotic while on antibiotic Flutter valve 4 -5 puffs 4 times a day. Continue Mucinex as you have been doing We will give you a specimen container for sputum collection 1 month after antibiotic is complete. We will place an order for sputum culture.( AFB, Culture and Fungus) Follow up appointment with [name] [name] or Dr. Lamonte [name] in 3 weeks. Follow up CT chest in 3 months. Please contact office for sooner follow up if symptoms do not improve or worsen or seek emergency care

## 2017-09-06 DIAGNOSIS — R072 Precordial pain: Secondary | ICD-10-CM | POA: Diagnosis not present

## 2017-09-06 DIAGNOSIS — R9431 Abnormal electrocardiogram [ECG] [EKG]: Secondary | ICD-10-CM | POA: Diagnosis not present

## 2017-09-06 DIAGNOSIS — R5383 Other fatigue: Secondary | ICD-10-CM | POA: Diagnosis not present

## 2017-09-06 DIAGNOSIS — K219 Gastro-esophageal reflux disease without esophagitis: Secondary | ICD-10-CM | POA: Diagnosis not present

## 2017-09-14 ENCOUNTER — Encounter: Payer: Self-pay | Admitting: Emergency Medicine

## 2017-09-14 ENCOUNTER — Ambulatory Visit: Payer: PPO | Admitting: Emergency Medicine

## 2017-09-14 DIAGNOSIS — J479 Bronchiectasis, uncomplicated: Secondary | ICD-10-CM

## 2017-09-14 DIAGNOSIS — K219 Gastro-esophageal reflux disease without esophagitis: Secondary | ICD-10-CM

## 2017-09-14 MED ORDER — OMEPRAZOLE 20 MG PO CPDR: 20.0000 mg | DELAYED_RELEASE_CAPSULE | Freq: Every day | ORAL | 2 refills | Status: DC

## 2017-09-14 NOTE — Patient Instructions (Addendum)
Please bring in a mucus sample in 3-4 weeks as planned.  We will do bacterial, mycobacterial fungal cultures on this sample. Use albuterol 2 puffs if needed for chest tightness, wheezing, shortness of breath.  Keep track of whether this medication helps so that we can discuss it at your follow-up visit. Use flutter valve daily to help clear mucus Continue fluticasone nasal spray, 2 sprays each nostril once a day. Please start omeprazole (Prilosec) 20 mg once a day until next visit to see if this helps your chest discomfort.  Take this medication 1 hour before eating. Follow with Dr Lamonte Sakai in 2 months or sooner if you have any problems.

## 2017-09-14 NOTE — Progress Notes (Signed)
Subjective:    Patient ID: [name] [name], female    DOB: Apr 03, 1935, 83 y.o.   MRN: [id number]  HPI  ROV 07/07/16 -- this is a follow up visit for pt with a hx bronchiectasis> She is colonized with M avium. We attempted to treat w abx but these were stopped due to side effects. I deferred restarting as her cough had improved. She tells me today that her breathing and cough are doing very well.  No sputum at all. Some clear nasal drainage. Her last CT chest was 03/2015. Able to exert.   ROV 07/14/17 --83 year old woman with a history of hypertension, hyperlipidemia, bronchiectasis with associated cough.  She is colonized with Mycobacterium avium (did not tolerate treatment due to side effects).  She is here today with her daughter who helps with the history.  Unfortunately she has been in the hospital twice since July, once for hyperkalemia (? Due to taking too much BP medication). Last week she had syncope, was treated for dehydration in ED. She notes that she has had increased mucous, green sputum for the last 2 weeks.   ROV 09/15/27 --   this is a follow-up visit for patient with a history of bronchiectasis, Mycobacterium avium (unable to complete treatment due to side effects), chronic cough. She was in the ED 08/31/17 with chest heaviness, not associated with dyspnea. Trop was negative, ECG reassuring. Started levaquin 1/17 > cough a bit better. Less mucous.      Objective:   Physical Exam Vitals:   09/14/17 1336  BP: 110/60  Pulse: 92  SpO2: 95%  Weight: 99 lb (44.9 kg)   Gen: Pleasant, thin woman, in no distress,  normal affect  ENT: No lesions,  mouth clear,  oropharynx clear, mild nasal congestion  Neck: No JVD, no TMG, no carotid bruits, no stridor  Lungs: No use of accessory muscles, no dullness to percussion, diminished BS in bases   Cardiovascular: RRR, heart sounds normal, no murmur or gallops, no peripheral edema  Musculoskeletal: No deformities, no cyanosis or  clubbing  Neuro: alert, non focal  Skin: Warm, no lesions or rashes    04/04/16 --  COMPARISON:  CT 06/06/2014  FINDINGS: Mediastinum/Nodes: No axillary or supraclavicular lymphadenopathy. No mediastinal hilar adenopathy. Coronary calcifications are present. No pericardial fluid. Esophagus is normal.  Lungs/Pleura: Again demonstrated bronchial wall thickening and cylindrical and varicose bronchiectasis primarily in the upper lobes. There is atelectasis within the lingula and RIGHT middle lobe similar to prior. Mild micro nodularity associated with this bronchiectasis pattern.  Within the LEFT lower lobe there is increased density of a previously seen ground-glass opacity. This now solid nodule measures 9 mm on image 33, series 3.  RIGHT upper lobe nodule along the fissure measures 6 mm is unchanged.  Upper abdomen: Limited view of the liver, kidneys, pancreas are unremarkable. Normal adrenal glands.  Musculoskeletal: No aggressive osseous lesion.  IMPRESSION: 1. New solid nodule in the LEFT lower lobe at site of prior ground-glass opacity with differential including a focus of infection versus neoplasm. Patient has a history of waxing and waning nodules therefore recommend short-term CT follow-up (1 month) and if lesion persists recommend FDG PET scan or biopsy. 2. Stable bronchiectasis and micro nodularity in the upper lobes most suggestive of chronic infection ( MIA or APBA)).     Assessment & Plan:   Bronchiectasis without acute exacerbation Acute exacerbation appears to be improved.  She still has subacute cough, chest discomfort/tightness, mucus production.  I anticipate  that it will increase while off antibiotics.  She has mycobacterial disease it has never been treated.  Unclear to me that she can tolerate said treatment.  She has not tried using albuterol at her times of chest discomfort and I think it reasonable for her to do this in order to sort out  whether it is beneficial.  We will plan to culture her sputum after she has been off antibiotics for several weeks.  No indication to repeat her CT scan of the chest right now but we may decide to do this at some point in the future.  Most recent imaging shows probable mycobacterial disease  Please bring in a mucus sample in 3-4 weeks as planned.  We will do bacterial, mycobacterial fungal cultures on this sample. Use albuterol 2 puffs if needed for chest tightness, wheezing, shortness of breath.  Keep track of whether this medication helps so that we can discuss it at your follow-up visit. Use flutter valve daily to help clear mucus Continue fluticasone nasal spray, 2 sprays each nostril once a day. Follow with Dr Lamonte Sakai in 2 months or sooner if you have any problems.  GERD (gastroesophageal reflux disease) Not currently on medication.  Question whether this may be responsible for her chest discomfort.  Start Prilosec until our next visit and see if she benefits.     Baltazar Apo, MD, PhD 09/14/2017, 2:03 PM  [facility] [phone] or if no answer [phone]

## 2017-09-14 NOTE — Assessment & Plan Note (Signed)
Acute exacerbation appears to be improved.  She still has subacute cough, chest discomfort/tightness, mucus production.  I anticipate that it will increase while off antibiotics.  She has mycobacterial disease it has never been treated.  Unclear to me that she can tolerate said treatment.  She has not tried using albuterol at her times of chest discomfort and I think it reasonable for her to do this in order to sort out whether it is beneficial.  We will plan to culture her sputum after she has been off antibiotics for several weeks.  No indication to repeat her CT scan of the chest right now but we may decide to do this at some point in the future.  Most recent imaging shows probable mycobacterial disease  Please bring in a mucus sample in 3-4 weeks as planned.  We will do bacterial, mycobacterial fungal cultures on this sample. Use albuterol 2 puffs if needed for chest tightness, wheezing, shortness of breath.  Keep track of whether this medication helps so that we can discuss it at your follow-up visit. Use flutter valve daily to help clear mucus Continue fluticasone nasal spray, 2 sprays each nostril once a day. Follow with Dr Lamonte Sakai in 2 months or sooner if you have any problems.

## 2017-09-14 NOTE — Addendum Note (Signed)
Addended by: Desmond Dike C on: 09/14/2017 02:07 PM   Modules accepted: Orders

## 2017-09-14 NOTE — Assessment & Plan Note (Signed)
Not currently on medication.  Question whether this may be responsible for her chest discomfort.  Start Prilosec until our next visit and see if she benefits.

## 2017-09-21 DIAGNOSIS — F5102 Adjustment insomnia: Secondary | ICD-10-CM | POA: Diagnosis not present

## 2017-09-21 DIAGNOSIS — F419 Anxiety disorder, unspecified: Secondary | ICD-10-CM | POA: Diagnosis not present

## 2017-09-21 DIAGNOSIS — F329 Major depressive disorder, single episode, unspecified: Secondary | ICD-10-CM | POA: Diagnosis not present

## 2017-09-21 DIAGNOSIS — I10 Essential (primary) hypertension: Secondary | ICD-10-CM | POA: Diagnosis not present

## 2017-09-23 ENCOUNTER — Encounter: Payer: Self-pay | Admitting: Cardiovascular Disease

## 2017-09-24 NOTE — Progress Notes (Deleted)
Cardiology Office Note   Date:  09/24/2017   ID:  [name] [name], DOB 1934-10-02, MRN [id number]  PCP:  Heywood Bene, PA-C  Cardiologist:   Jenkins Rouge, MD   No chief complaint on file.     History of Present Illness: [name] [name] is a 83 y.o. female who presents for consultation regarding chest pain Referred by Dr Jimmye Norman  Reviewed his office notes from 09/21/17 CRF;s HLD, HTN, Depression severe been tried on Lexapro, Zoloft , Prozac And xanax for sleep. Most recently placed on welbutrin.  Sees Dr Lamonte Sakai for MAI. Office note from 08/06/17 also reviewed Atypical pain relieved with xanax Woke her from sleep. "Something is inside my chest" Some associated palpitations   History of GERD but felt different Lasted intermittently for 48 hours D dimer 1110  CTA negative for PE  Placed on aspirin  ECG non acute SB rate 66 PAC otherwise normal    Past Medical History:  Diagnosis Date  . Heart murmur   . Hypercholesteremia   . Hypertension     Past Surgical History:  Procedure Laterality Date  . VIDEO BRONCHOSCOPY Bilateral 04/16/2015   Procedure: VIDEO BRONCHOSCOPY WITHOUT FLUORO;  Surgeon: Collene Gobble, MD;  Location: [facility];  Service: Cardiopulmonary;  Laterality: Bilateral;     Current Outpatient Medications  Medication Sig Dispense Refill  . acetaminophen (TYLENOL) 500 MG tablet Take 500 mg by mouth every 6 (six) hours as needed for mild pain.     Marland Kitchen albuterol (PROVENTIL HFA;VENTOLIN HFA) 108 (90 Base) MCG/ACT inhaler Inhale 2 puffs into the lungs every 6 (six) hours as needed for wheezing or shortness of breath. 1 Inhaler 2  . ALPRAZolam (XANAX) 0.5 MG tablet 1/2 - 1 TABLET BY MOUTH TWICE A DAY AS NEEDED  5  . aspirin 81 MG chewable tablet Chew 81 mg by mouth daily.    . carvedilol (COREG) 12.5 MG tablet Take 1 tablet (12.5 mg total) by mouth 2 (two) times daily with a meal. 60 tablet 0  . donepezil (ARICEPT) 5 MG tablet Take 1 tablet (5 mg total) by mouth  at bedtime. 30 tablet 0  . feeding supplement (BOOST / RESOURCE BREEZE) LIQD Take 1 Container by mouth 2 (two) times daily between meals. 30 Container 0  . feeding supplement, ENSURE ENLIVE, (ENSURE ENLIVE) LIQD Take 237 mLs by mouth 2 (two) times daily between meals. 237 mL 12  . fluticasone (FLONASE) 50 MCG/ACT nasal spray Place 2 sprays into both nostrils daily. 16 g 5  . levofloxacin (LEVAQUIN) 500 MG tablet Take 1 tablet (500 mg total) by mouth daily. 10 tablet 0  . Multiple Vitamin (MULTIVITAMIN WITH MINERALS) TABS tablet Take 1 tablet by mouth daily. 30 tablet 0  . omeprazole (PRILOSEC) 20 MG capsule Take 1 capsule (20 mg total) by mouth daily. 30 capsule 2  . polyethylene glycol (MIRALAX / GLYCOLAX) packet Take 17 g by mouth daily. 14 each 0  . QUEtiapine (SEROQUEL) 25 MG tablet Take 0.5 tablets (12.5 mg total) by mouth at bedtime. 30 tablet 0  . Respiratory Therapy Supplies (FLUTTER) DEVI Use as directed. 1 each 0  . senna-docusate (SENOKOT-S) 8.6-50 MG tablet Take 1 tablet by mouth at bedtime. 30 tablet 0  . simvastatin (ZOCOR) 40 MG tablet Take 40 mg by mouth daily. Once daily     No current facility-administered medications for this visit.     Allergies:   Lexapro [escitalopram] and Thiazide-type diuretics    Social  History:  The patient  reports that  has never smoked. she has never used smokeless tobacco. She reports that she does not drink alcohol or use drugs.   Family History:  The patient's family history includes Brain cancer in her brother; Emphysema in her father.    ROS:  Please see the history of present illness.   Otherwise, review of systems are positive for none.   All other systems are reviewed and negative.    PHYSICAL EXAM: VS:  There were no vitals taken for this visit. , BMI There is no height or weight on file to calculate BMI. Affect appropriate Healthy:  appears stated age 37: normal Neck supple with no adenopathy JVP normal no bruits no  thyromegaly Lungs clear with no wheezing and good diaphragmatic motion Heart:  S1/S2 no murmur, no rub, gallop or click PMI normal Abdomen: benighn, BS positve, no tenderness, no AAA no bruit.  No HSM or HJR Distal pulses intact with no bruits No edema Neuro non-focal Skin warm and dry No muscular weakness    EKG:  SR PAC rate 66 otherwise normal 08/05/17   Recent Labs: 02/08/2017: TSH 1.053 02/09/2017: ALT 19 02/12/2017: Magnesium 1.9 08/31/2017: BUN 9; Creatinine, Ser 0.66; Hemoglobin 12.7; Platelets 269; Potassium 3.9; Sodium 139    Lipid Panel    Component Value Date/Time   CHOL  10/12/2009 0447    144        ATP III CLASSIFICATION:  <200     mg/dL   Desirable  200-239  mg/dL   Borderline High  >=240    mg/dL   High          TRIG 176 (H) 10/12/2009 0447   HDL 42 10/12/2009 0447   CHOLHDL 3.4 10/12/2009 0447   VLDL 35 10/12/2009 0447   LDLCALC  10/12/2009 0447    67        Total Cholesterol/HDL:CHD Risk Coronary Heart Disease Risk Table                     Men   Women  1/2 Average Risk   3.4   3.3  Average Risk       5.0   4.4  2 X Average Risk   9.6   7.1  3 X Average Risk  23.4   11.0        Use the calculated Patient Ratio above and the CHD Risk Table to determine the patient's CHD Risk.        ATP III CLASSIFICATION (LDL):  <100     mg/dL   Optimal  100-129  mg/dL   Near or Above                    Optimal  130-159  mg/dL   Borderline  160-189  mg/dL   High  >190     mg/dL   Very High      Wt Readings from Last 3 Encounters:  09/14/17 99 lb (44.9 kg)  09/03/17 96 lb 3.2 oz (43.6 kg)  08/31/17 98 lb (44.5 kg)      Other studies Reviewed: Additional studies/ records that were reviewed today include: [facility] notes primary at [facility] CTA ECG .    ASSESSMENT AND PLAN:  1.  Chest pain:  *** 2. HTN: Well controlled.  Continue current medications and low sodium Dash type diet.   3. HLD:  On statin labs fine f/u primary  4.  Depression 5. GERD:  Continue prilosec discussed weight loss and low carb diet    Current medicines are reviewed at length with the patient today.  The patient does not have concerns regarding medicines.  The following changes have been made:  ***  Labs/ tests ordered today include: *** No orders of the defined types were placed in this encounter.    Disposition:   FU with cardiology PRN      Signed, Jenkins Rouge, MD  09/24/2017 4:54 PM    [address], [place], [place]  [address] Phone: [phone]; Fax: [phone]

## 2017-09-28 DIAGNOSIS — I10 Essential (primary) hypertension: Secondary | ICD-10-CM | POA: Diagnosis not present

## 2017-09-28 DIAGNOSIS — E7800 Pure hypercholesterolemia, unspecified: Secondary | ICD-10-CM | POA: Diagnosis not present

## 2017-10-01 ENCOUNTER — Ambulatory Visit: Payer: PPO | Admitting: Cardiovascular Disease

## 2017-10-19 DIAGNOSIS — F0151 Vascular dementia with behavioral disturbance: Secondary | ICD-10-CM | POA: Diagnosis not present

## 2017-10-19 DIAGNOSIS — R110 Nausea: Secondary | ICD-10-CM | POA: Diagnosis not present

## 2017-10-19 DIAGNOSIS — F329 Major depressive disorder, single episode, unspecified: Secondary | ICD-10-CM | POA: Diagnosis not present

## 2017-11-16 ENCOUNTER — Ambulatory Visit: Payer: PPO | Admitting: Emergency Medicine

## 2017-11-16 ENCOUNTER — Encounter: Payer: Self-pay | Admitting: Emergency Medicine

## 2017-11-16 DIAGNOSIS — J479 Bronchiectasis, uncomplicated: Secondary | ICD-10-CM

## 2017-11-16 DIAGNOSIS — A310 Pulmonary mycobacterial infection: Secondary | ICD-10-CM

## 2017-11-16 MED ORDER — FLUTICASONE PROPIONATE 50 MCG/ACT NA SUSP: 2.0000 | Freq: Every day | NASAL | 2 refills | Status: DC

## 2017-11-16 MED ORDER — ALBUTEROL SULFATE (2.5 MG/3ML) 0.083% IN NEBU: 2.5000 mg | INHALATION_SOLUTION | Freq: Four times a day (QID) | RESPIRATORY_TRACT | 12 refills | Status: AC | PRN

## 2017-11-16 NOTE — Progress Notes (Signed)
flo

## 2017-11-16 NOTE — Assessment & Plan Note (Signed)
We will retry albuterol.  She does not believe we have called in the prescription last time.  I advised her to use 2 puffs if needed and keep track of whether she benefited from the medication so that we could decide whether to continue it.  I will not repeat her CT scan at this time given her clinical stability.  I would still like to get a sputum culture if she can produce one.

## 2017-11-16 NOTE — Patient Instructions (Signed)
Please continue omeprazole as you are taking it. Restart fluticasone nasal spray, 2 sprays each nostril once a day.  We will send a prescription for this. We will send a prescription for albuterol.  Use 2 puffs if needed for shortness of breath, coughing, chest tightness, wheezing.  Keep track of whether this medication helps you so that we can decide in the future whether to continue it. We will hold off on a repeat CT scan of your chest at this time. Keep your sample container so that you can collect a sputum sample to bring here for culture should your green mucous production return.  Follow with Dr Lamonte Sakai in 6 months or sooner if you have any problems

## 2017-11-16 NOTE — Assessment & Plan Note (Signed)
2 components to her cough.  There is a dry upper airway cough and also her typical bronchiectasis with sputum production.  She is actually not having much in the way of sputum production or bronchiectatic cough right now.  Her cough is for the most part quiet.  Question whether adding omeprazole has been beneficial.  We will continue this.  I will add back her fluticasone nasal spray.

## 2017-11-16 NOTE — Progress Notes (Addendum)
Subjective:    Patient ID: [name] [name], female    DOB: 1934/12/28, 83 y.o.   MRN: [id number]  HPI  ROV 07/07/16 -- this is a follow up visit for pt with a hx bronchiectasis> She is colonized with M avium. We attempted to treat w abx but these were stopped due to side effects. I deferred restarting as her cough had improved. She tells me today that her breathing and cough are doing very well.  No sputum at all. Some clear nasal drainage. Her last CT chest was 03/2015. Able to exert.   ROV 07/14/17 --83 year old woman with a history of hypertension, hyperlipidemia, bronchiectasis with associated cough.  She is colonized with Mycobacterium avium (did not tolerate treatment due to side effects).  She is here today with her daughter who helps with the history.  Unfortunately she has been in the hospital twice since July, once for hyperkalemia (? Due to taking too much BP medication). Last week she had syncope, was treated for dehydration in ED. She notes that she has had increased mucous, green sputum for the last 2 weeks.   ROV 09/15/27 --   this is a follow-up visit for patient with a history of bronchiectasis, Mycobacterium avium (unable to complete treatment due to side effects), chronic cough. She was in the ED 08/31/17 with chest heaviness, not associated with dyspnea. Trop was negative, ECG reassuring. Started levaquin 1/17 > cough a bit better. Less mucous.   ROV 11/16/17 --patient is an 83 year old woman with a history of bronchiectasis.  She is been colonized with Mycobacterium avium, unable to tolerate antibiotics due to side effects.  She has chronic cough that is multifactorial.  At her last visit we continue fluticasone nasal spray, started omeprazole. She has run out of the flonase.  I also ordered sputum cultures, not yet collected. She still has some dry cough, but not every day right now.     Objective:   Physical Exam Vitals:   11/16/17 1354  BP: (!) 142/84  Pulse: 87  SpO2: 97%   Weight: 94 lb (42.6 kg)  Height: 5\' 3"  (1.6 m)   Gen: Pleasant, thin woman, in no distress,  normal affect  ENT: No lesions,  mouth clear,  oropharynx clear, mild nasal congestion  Neck: No JVD, no TMG, no carotid bruits, no stridor  Lungs: No use of accessory muscles, no dullness to percussion, diminished BS in bases   Cardiovascular: RRR, heart sounds normal, no murmur or gallops, no peripheral edema  Musculoskeletal: No deformities, no cyanosis or clubbing  Neuro: alert, non focal  Skin: Warm, no lesions or rashes    04/04/16 --  COMPARISON:  CT 06/06/2014  FINDINGS: Mediastinum/Nodes: No axillary or supraclavicular lymphadenopathy. No mediastinal hilar adenopathy. Coronary calcifications are present. No pericardial fluid. Esophagus is normal.  Lungs/Pleura: Again demonstrated bronchial wall thickening and cylindrical and varicose bronchiectasis primarily in the upper lobes. There is atelectasis within the lingula and RIGHT middle lobe similar to prior. Mild micro nodularity associated with this bronchiectasis pattern.  Within the LEFT lower lobe there is increased density of a previously seen ground-glass opacity. This now solid nodule measures 9 mm on image 33, series 3.  RIGHT upper lobe nodule along the fissure measures 6 mm is unchanged.  Upper abdomen: Limited view of the liver, kidneys, pancreas are unremarkable. Normal adrenal glands.  Musculoskeletal: No aggressive osseous lesion.  IMPRESSION: 1. New solid nodule in the LEFT lower lobe at site of prior ground-glass opacity with differential  including a focus of infection versus neoplasm. Patient has a history of waxing and waning nodules therefore recommend short-term CT follow-up (1 month) and if lesion persists recommend FDG PET scan or biopsy. 2. Stable bronchiectasis and micro nodularity in the upper lobes most suggestive of chronic infection ( MIA or APBA)).     Assessment & Plan:    Chronic cough 2 components to her cough.  There is a dry upper airway cough and also her typical bronchiectasis with sputum production.  She is actually not having much in the way of sputum production or bronchiectatic cough right now.  Her cough is for the most part quiet.  Question whether adding omeprazole has been beneficial.  We will continue this.  I will add back her fluticasone nasal spray.   Bronchiectasis without acute exacerbation We will retry albuterol.  She does not believe we have called in the prescription last time.  I advised her to use 2 puffs if needed and keep track of whether she benefited from the medication so that we could decide whether to continue it.  I will not repeat her CT scan at this time given her clinical stability.  I would still like to get a sputum culture if she can produce one.  Mycobacterium avium infection ([place]) Never treated     Baltazar Apo, MD, PhD 11/19/2017, 12:36 PM [facility] [phone] or if no answer [phone]

## 2017-11-16 NOTE — Assessment & Plan Note (Signed)
Never treated

## 2017-12-21 DIAGNOSIS — R634 Abnormal weight loss: Secondary | ICD-10-CM | POA: Diagnosis not present

## 2017-12-21 DIAGNOSIS — R5383 Other fatigue: Secondary | ICD-10-CM | POA: Diagnosis not present

## 2017-12-21 DIAGNOSIS — R002 Palpitations: Secondary | ICD-10-CM | POA: Diagnosis not present

## 2017-12-21 DIAGNOSIS — I499 Cardiac arrhythmia, unspecified: Secondary | ICD-10-CM | POA: Diagnosis not present

## 2017-12-21 DIAGNOSIS — R636 Underweight: Secondary | ICD-10-CM | POA: Diagnosis not present

## 2017-12-22 ENCOUNTER — Telehealth: Payer: Self-pay

## 2017-12-22 NOTE — Telephone Encounter (Signed)
SENT REFERRAL TO SCHEDULING 

## 2017-12-30 ENCOUNTER — Encounter: Payer: Self-pay | Admitting: Cardiovascular Disease

## 2017-12-30 ENCOUNTER — Telehealth: Payer: Self-pay | Admitting: *Deleted

## 2017-12-30 NOTE — Telephone Encounter (Signed)
NOTES FAXED TO [facility] FROM [place] DR. Clyde Lundborg [phone]

## 2018-01-01 ENCOUNTER — Encounter: Payer: Self-pay | Admitting: Cardiovascular Disease

## 2018-01-01 ENCOUNTER — Ambulatory Visit: Payer: Self-pay | Admitting: Cardiovascular Disease

## 2018-01-01 ENCOUNTER — Ambulatory Visit (INDEPENDENT_AMBULATORY_CARE_PROVIDER_SITE_OTHER): Payer: PPO | Admitting: Cardiovascular Disease

## 2018-01-01 VITALS — BP 106/66 | HR 68 | Ht 63.0 in | Wt 90.0 lb

## 2018-01-01 DIAGNOSIS — R002 Palpitations: Secondary | ICD-10-CM | POA: Diagnosis not present

## 2018-01-01 DIAGNOSIS — R079 Chest pain, unspecified: Secondary | ICD-10-CM

## 2018-01-01 DIAGNOSIS — I2510 Atherosclerotic heart disease of native coronary artery without angina pectoris: Secondary | ICD-10-CM | POA: Diagnosis not present

## 2018-01-01 HISTORY — DX: Atherosclerotic heart disease of native coronary artery without angina pectoris: I25.10

## 2018-01-01 HISTORY — DX: Palpitations: R00.2

## 2018-01-01 MED ORDER — METOPROLOL SUCCINATE ER 25 MG PO TB24: ORAL_TABLET | ORAL | 5 refills | Status: DC

## 2018-01-01 MED ORDER — METOPROLOL SUCCINATE ER 50 MG PO TB24: 50.0000 mg | ORAL_TABLET | Freq: Every day | ORAL | 5 refills | Status: DC

## 2018-01-01 NOTE — Progress Notes (Signed)
Cardiology Office Note   Date:  01/01/2018   ID:  [name] [name], DOB 02/14/1935, MRN [id number]  PCP:  Heywood Bene, PA-C  Cardiologist:   Skeet Latch, MD   Chief Complaint  Patient presents with  . New Patient (Initial Visit)      History of Present Illness: [name] [name] is a 83 y.o. female with dementia  who is being seen today for the evaluation of palpitations at the request of [name] [name], *.   [name] [name] has several months of heart racing and chest pain.  She was seen in the ED with chest pain 08/2017.  At that time cardiac enzymes were negative.  She was noted to have PACs.  She saw her PCP on 12/22/2017 and to continue to complain of heart racing for the preceding 3 or 4 weeks.  Her EKG at that appointment showed sinus rhythm with atrial bigeminy.  She states that the heart racing is constant but worse at times.  It is not associated with exertion.  She sometimes has shortness of breath but not with each episode.  There is no associated chest pain.  She has not had any lower extremity edema, orthopnea, or PND.  She does not get much exercise.  Her daughter has given her Xanax which does seem to help.  She also reports associated lightheadedness and near syncope but no frank syncope.  When it occurs she thinks she is having a heart attack.,  Several teas throughout the day, and 1 Coke in the afternoon.  [name] [name] has lost 10 pounds in the last 2 months.  She has not had any melena or hematochezia.  Her appetite is okay.  Her daughter also supplements her meals with Ensure.  She has a light breakfast typically either consisting of  a single piece of toast and a piece of bacon or an apple turnover.  She and her daughter eat out for lunch and she typically has most of a combination meal at [place] on results.  For dinner she has either soup or sandwich and nothing else.  She does not snack in between meals.   Past Medical History:  Diagnosis Date  .  Anxiety   . Coronary artery calcification seen on CAT scan 01/01/2018  . Depression   . GERD (gastroesophageal reflux disease)   . Heart murmur   . Hypercholesteremia   . Hypertension   . IFG (impaired fasting glucose)   . Palpitation 01/01/2018  . Tubular adenoma of colon     Past Surgical History:  Procedure Laterality Date  . VIDEO BRONCHOSCOPY Bilateral 04/16/2015   Procedure: VIDEO BRONCHOSCOPY WITHOUT FLUORO;  Surgeon: Collene Gobble, MD;  Location: [facility];  Service: Cardiopulmonary;  Laterality: Bilateral;     Current Outpatient Medications  Medication Sig Dispense Refill  . acetaminophen (TYLENOL) 500 MG tablet Take 500 mg by mouth every 6 (six) hours as needed for mild pain.     Marland Kitchen albuterol (PROVENTIL HFA;VENTOLIN HFA) 108 (90 Base) MCG/ACT inhaler Inhale 2 puffs into the lungs every 6 (six) hours as needed for wheezing or shortness of breath. 1 Inhaler 2  . albuterol (PROVENTIL) (2.5 MG/3ML) 0.083% nebulizer solution Take 3 mLs (2.5 mg total) by nebulization every 6 (six) hours as needed for wheezing or shortness of breath. 75 mL 12  . ALPRAZolam (XANAX) 0.5 MG tablet 1/2 - 1 TABLET BY MOUTH TWICE A DAY AS NEEDED  5  . aspirin 81 MG chewable tablet  Chew 81 mg by mouth daily.    Marland Kitchen buPROPion (WELLBUTRIN XL) 150 MG 24 hr tablet Take by mouth.    . donepezil (ARICEPT) 5 MG tablet Take 1 tablet (5 mg total) by mouth at bedtime. 30 tablet 0  . feeding supplement (BOOST / RESOURCE BREEZE) LIQD Take 1 Container by mouth 2 (two) times daily between meals. 30 Container 0  . feeding supplement, ENSURE ENLIVE, (ENSURE ENLIVE) LIQD Take 237 mLs by mouth 2 (two) times daily between meals. 237 mL 12  . fluticasone (FLONASE) 50 MCG/ACT nasal spray Place 2 sprays into both nostrils daily. 16 g 2  . Multiple Vitamin (MULTIVITAMIN WITH MINERALS) TABS tablet Take 1 tablet by mouth daily. 30 tablet 0  . omeprazole (PRILOSEC) 20 MG capsule Take 1 capsule (20 mg total) by mouth daily. 30  capsule 2  . polyethylene glycol (MIRALAX / GLYCOLAX) packet Take 17 g by mouth daily. 14 each 0  . Respiratory Therapy Supplies (FLUTTER) DEVI Use as directed. 1 each 0  . senna-docusate (SENOKOT-S) 8.6-50 MG tablet Take 1 tablet by mouth at bedtime. 30 tablet 0  . simvastatin (ZOCOR) 40 MG tablet Take 40 mg by mouth daily. Once daily    . metoprolol succinate (TOPROL XL) 50 MG 24 hr tablet Take 1 tablet (50 mg total) by mouth daily. Take with or immediately following a meal. 30 tablet 5   No current facility-administered medications for this visit.     Allergies:   Lexapro [escitalopram] and Thiazide-type diuretics    Social History:  The patient  reports that she has never smoked. She has never used smokeless tobacco. She reports that she does not drink alcohol or use drugs.   Family History:  The patient's family history includes Alzheimer's disease in her mother; Brain cancer in her brother; Cancer in her daughter; Emphysema in her father; Kidney cancer in her brother.    ROS:  Please see the history of present illness.   Otherwise, review of systems are positive for none.   All other systems are reviewed and negative.    PHYSICAL EXAM: VS:  BP 106/66   Pulse 68   Ht 5\' 3"  (1.6 m)   Wt 90 lb (40.8 kg)   BMI 15.94 kg/m  , BMI Body mass index is 15.94 kg/m. GENERAL:  Well appearing HEENT:  Pupils equal round and reactive, fundi not visualized, oral mucosa unremarkable NECK:  No jugular venous distention, waveform within normal limits, carotid upstroke brisk and symmetric, no bruits LUNGS:  Clear to auscultation bilaterally HEART:  Mostly regular with occasional ectopy.   PMI not displaced or sustained,S1 and S2 within normal limits, no S3, no S4, no clicks, no rubs, no murmurs ABD:  Flat, positive bowel sounds normal in frequency in pitch, no bruits, no rebound, no guarding, no midline pulsatile mass, no hepatomegaly, no splenomegaly EXT:  2 plus pulses throughout, no edema, no  cyanosis no clubbing SKIN:  No rashes no nodules NEURO:  Cranial nerves II through XII grossly intact, motor grossly intact throughout PSYCH:  Cognitively intact, oriented to person place and time   EKG:  EKG is not ordered today. 12/21/17: Sinus rhythm.  Atrial bigeminy and atrial tachycardia.  Rate 82 bpm.   Recent Labs: 02/08/2017: TSH 1.053 02/09/2017: ALT 19 02/12/2017: Magnesium 1.9 08/31/2017: BUN 9; Creatinine, Ser 0.66; Hemoglobin 12.7; Platelets 269; Potassium 3.9; Sodium 139    Lipid Panel    Component Value Date/Time   CHOL  10/12/2009 0447  144        ATP III CLASSIFICATION:  <200     mg/dL   Desirable  200-239  mg/dL   Borderline High  >=240    mg/dL   High          TRIG 176 (H) 10/12/2009 0447   HDL 42 10/12/2009 0447   CHOLHDL 3.4 10/12/2009 0447   VLDL 35 10/12/2009 0447   LDLCALC  10/12/2009 0447    67        Total Cholesterol/HDL:CHD Risk Coronary Heart Disease Risk Table                     Men   Women  1/2 Average Risk   3.4   3.3  Average Risk       5.0   4.4  2 X Average Risk   9.6   7.1  3 X Average Risk  23.4   11.0        Use the calculated Patient Ratio above and the CHD Risk Table to determine the patient's CHD Risk.        ATP III CLASSIFICATION (LDL):  <100     mg/dL   Optimal  100-129  mg/dL   Near or Above                    Optimal  130-159  mg/dL   Borderline  160-189  mg/dL   High  >190     mg/dL   Very High      Wt Readings from Last 3 Encounters:  01/01/18 90 lb (40.8 kg)  11/16/17 94 lb (42.6 kg)  09/14/17 99 lb (44.9 kg)      ASSESSMENT AND PLAN:  # PACs: # Palpitations: Ms. Yazdi has symptomatic palpitations.  Thyroid function was recently checked and was unremarkable.  Blood counts and electrolytes have been unremarkable.  Her blood pressure is low not leaving much room for beta-blockers.  We will stop carvedilol and start metoprolol succinate 50 mg daily.  # Asymptomatic coronary calcification: # Atypical  chest pain: Add metoprolol as above.  Check Lexiscan Myoview.  Continue aspirin 81 mg daily and simvastatin.  Current medicines are reviewed at length with the patient today.  The patient does not have concerns regarding medicines.  The following changes have been made: Stop carvedilol.  Start metoprolol.  Labs/ tests ordered today include:   Orders Placed This Encounter  Procedures  . MYOCARDIAL PERFUSION IMAGING     Disposition:   FU with Bud Kaeser C. Oval Linsey, MD, [facility] in 4 months.  APP in 1 month.     Signed, Nethan Caudillo C. Oval Linsey, MD, [facility]  01/01/2018 6:02 PM    [facility]

## 2018-01-01 NOTE — Patient Instructions (Addendum)
Medication Instructions:  START METOPROLOL SUC 50 MG DAILY   STOP CARVEDILOL   Labwork: none  Testing/Procedures: Your physician has requested that you have a lexiscan myoview. For further information please visit [email or web address]. Please follow instruction sheet, as given. OK TO TAKE YOUR MEDICATIONS   Follow-Up: Your physician recommends that you schedule a follow-up appointment in: [place]   Your physician recommends that you schedule a follow-up appointment in: 4 month ov   If you need a refill on your cardiac medications before your next appointment, please call your pharmacy.  Cardiac Nuclear Scan A cardiac nuclear scan is a test that measures blood flow to the heart when a person is resting and when he or she is exercising. The test looks for problems such as:  Not enough blood reaching a portion of the heart.  The heart muscle not working normally.  You may need this test if:  You have heart disease.  You have had abnormal lab results.  You have had heart surgery or angioplasty.  You have chest pain.  You have shortness of breath.  In this test, a radioactive dye (tracer) is injected into your bloodstream. After the tracer has traveled to your heart, an imaging device is used to measure how much of the tracer is absorbed by or distributed to various areas of your heart. This procedure is usually done at a hospital and takes 2-4 hours. Tell a health care provider about:  Any allergies you have.  All medicines you are taking, including vitamins, herbs, eye drops, creams, and over-the-counter medicines.  Any problems you or family members have had with the use of anesthetic medicines.  Any blood disorders you have.  Any surgeries you have had.  Any medical conditions you have.  Whether you are pregnant or may be pregnant. What are the risks? Generally, this is a safe procedure. However, problems may occur, including:  Serious chest pain and  heart attack. This is only a risk if the stress portion of the test is done.  Rapid heartbeat.  Sensation of warmth in your chest. This usually passes quickly.  What happens before the procedure?  Ask your health care provider about changing or stopping your regular medicines. This is especially important if you are taking diabetes medicines or blood thinners.  Remove your jewelry on the day of the procedure. What happens during the procedure?  An IV tube will be inserted into one of your veins.  Your health care provider will inject a small amount of radioactive tracer through the tube.  You will wait for 20-40 minutes while the tracer travels through your bloodstream.  Your heart activity will be monitored with an electrocardiogram (ECG).  You will lie down on an exam table.  Images of your heart will be taken for about 15-20 minutes.  You may be asked to exercise on a treadmill or stationary bike. While you exercise, your heart's activity will be monitored with an ECG, and your blood pressure will be checked. If you are unable to exercise, you may be given a medicine to increase blood flow to parts of your heart.  When blood flow to your heart has peaked, a tracer will again be injected through the IV tube.  After 20-40 minutes, you will get back on the exam table and have more images taken of your heart.  When the procedure is over, your IV tube will be removed. The procedure may vary among health care providers and hospitals. Depending  on the type of tracer used, scans may need to be repeated 3-4 hours later. What happens after the procedure?  Unless your health care provider tells you otherwise, you may return to your normal schedule, including diet, activities, and medicines.  Unless your health care provider tells you otherwise, you may increase your fluid intake. This will help flush the contrast dye from your body. Drink enough fluid to keep your urine clear or pale  yellow.  It is up to you to get your test results. Ask your health care provider, or the department that is doing the test, when your results will be ready. Summary  A cardiac nuclear scan measures the blood flow to the heart when a person is resting and when he or she is exercising.  You may need this test if you are at risk for heart disease.  Tell your health care provider if you are pregnant.  Unless your health care provider tells you otherwise, increase your fluid intake. This will help flush the contrast dye from your body. Drink enough fluid to keep your urine clear or pale yellow. This information is not intended to replace advice given to you by your health care provider. Make sure you discuss any questions you have with your health care provider. Document Released: 08/29/2004 Document Revised: 08/06/2016 Document Reviewed: 07/13/2013 Elsevier Interactive Patient Education  2017 [employer].

## 2018-01-05 ENCOUNTER — Other Ambulatory Visit: Payer: Self-pay | Admitting: Physician Assistant

## 2018-01-05 DIAGNOSIS — R636 Underweight: Secondary | ICD-10-CM

## 2018-01-05 DIAGNOSIS — R918 Other nonspecific abnormal finding of lung field: Secondary | ICD-10-CM

## 2018-01-08 ENCOUNTER — Other Ambulatory Visit: Payer: Self-pay

## 2018-01-08 ENCOUNTER — Telehealth (HOSPITAL_COMMUNITY): Payer: Self-pay

## 2018-01-08 NOTE — Telephone Encounter (Signed)
Encounter complete. 

## 2018-01-12 ENCOUNTER — Telehealth (HOSPITAL_COMMUNITY): Payer: Self-pay

## 2018-01-12 NOTE — Telephone Encounter (Signed)
Encounter complete. 

## 2018-01-13 ENCOUNTER — Ambulatory Visit (HOSPITAL_COMMUNITY)
Admission: RE | Admit: 2018-01-13 | Payer: PPO | Source: Ambulatory Visit | Attending: Cardiovascular Disease | Admitting: Cardiovascular Disease

## 2018-01-26 ENCOUNTER — Telehealth (HOSPITAL_COMMUNITY): Payer: Self-pay

## 2018-01-26 NOTE — Telephone Encounter (Signed)
Encounter complete. 

## 2018-01-27 ENCOUNTER — Telehealth (HOSPITAL_COMMUNITY): Payer: Self-pay

## 2018-01-27 NOTE — Telephone Encounter (Signed)
Encounter complete. 

## 2018-01-28 ENCOUNTER — Ambulatory Visit (HOSPITAL_COMMUNITY)
Admission: RE | Admit: 2018-01-28 | Discharge: 2018-01-28 | Disposition: A | Discharge status: Home / Self Care | Payer: PPO | Source: Ambulatory Visit | Attending: Cardiology | Admitting: Cardiology

## 2018-01-28 DIAGNOSIS — R079 Chest pain, unspecified: Secondary | ICD-10-CM | POA: Insufficient documentation

## 2018-01-28 LAB — MYOCARDIAL PERFUSION IMAGING
CHL CUP NUCLEAR SDS: 0
CHL CUP RESTING HR STRESS: 84 {beats}/min
NUC STRESS TID: 1.04
Peak HR: 107 {beats}/min
SRS: 0
SSS: 0

## 2018-01-28 MED ORDER — TECHNETIUM TC 99M TETROFOSMIN IV KIT
31.6000 | PACK | Freq: Once | INTRAVENOUS | Status: AC | PRN
  Administered 2018-01-28: 31.6 via INTRAVENOUS
  Filled 2018-01-28: qty 32

## 2018-01-28 MED ORDER — TECHNETIUM TC 99M TETROFOSMIN IV KIT
10.0000 | PACK | Freq: Once | INTRAVENOUS | Status: AC | PRN
  Administered 2018-01-28: 10 via INTRAVENOUS
  Filled 2018-01-28: qty 10

## 2018-01-28 MED ORDER — REGADENOSON 0.4 MG/5ML IV SOLN
0.4000 mg | Freq: Once | INTRAVENOUS | Status: AC
  Administered 2018-01-28: 0.4 mg via INTRAVENOUS

## 2018-02-01 ENCOUNTER — Ambulatory Visit: Payer: Self-pay | Admitting: Physician Assistant

## 2018-02-03 ENCOUNTER — Encounter: Payer: Self-pay | Admitting: Cardiology

## 2018-02-03 ENCOUNTER — Ambulatory Visit: Payer: PPO | Admitting: Cardiology

## 2018-02-03 VITALS — BP 191/83 | HR 61 | Ht 63.0 in | Wt 91.2 lb

## 2018-02-03 DIAGNOSIS — R05 Cough: Secondary | ICD-10-CM

## 2018-02-03 DIAGNOSIS — R053 Chronic cough: Secondary | ICD-10-CM

## 2018-02-03 DIAGNOSIS — F0391 Unspecified dementia with behavioral disturbance: Secondary | ICD-10-CM | POA: Diagnosis not present

## 2018-02-03 DIAGNOSIS — R002 Palpitations: Secondary | ICD-10-CM

## 2018-02-03 DIAGNOSIS — J479 Bronchiectasis, uncomplicated: Secondary | ICD-10-CM

## 2018-02-03 DIAGNOSIS — I10 Essential (primary) hypertension: Secondary | ICD-10-CM

## 2018-02-03 MED ORDER — AMLODIPINE BESYLATE 2.5 MG PO TABS: 2.5000 mg | ORAL_TABLET | Freq: Every day | ORAL | 3 refills | Status: AC

## 2018-02-03 MED ORDER — METOPROLOL SUCCINATE ER 50 MG PO TB24: 50.0000 mg | ORAL_TABLET | Freq: Every day | ORAL | 3 refills | Status: AC

## 2018-02-03 NOTE — Progress Notes (Signed)
02/03/2018 [name] [name]   12/17/1934  [id number]  Primary Physician Heywood Bene, PA-C Primary Cardiologist: Dr Oval Linsey  HPI:  Pleasant, demented, 83 y/o female referred to Dr Oval Linsey for palpitations in May 2019. The pt has a history of HTN and was on Coreg. Dr Oval Linsey stopped the Coreg and started Toprol. The pt underwent lexiscan Myoview. She is in the office today for follow up. Her daughter accompanied her. She says her mother is doing much better on the Toprol. She is not complaining of palpitations and she has more energy. Her Myoview was normal.    Current Outpatient Medications  Medication Sig Dispense Refill  . acetaminophen (TYLENOL) 500 MG tablet Take 500 mg by mouth every 6 (six) hours as needed for mild pain.     Marland Kitchen albuterol (PROVENTIL HFA;VENTOLIN HFA) 108 (90 Base) MCG/ACT inhaler Inhale 2 puffs into the lungs every 6 (six) hours as needed for wheezing or shortness of breath. 1 Inhaler 2  . albuterol (PROVENTIL) (2.5 MG/3ML) 0.083% nebulizer solution Take 3 mLs (2.5 mg total) by nebulization every 6 (six) hours as needed for wheezing or shortness of breath. 75 mL 12  . ALPRAZolam (XANAX) 0.5 MG tablet 1/2 - 1 TABLET BY MOUTH TWICE A DAY AS NEEDED  5  . aspirin 81 MG chewable tablet Chew 81 mg by mouth daily.    Marland Kitchen buPROPion (WELLBUTRIN XL) 150 MG 24 hr tablet Take by mouth.    . donepezil (ARICEPT) 5 MG tablet Take 1 tablet (5 mg total) by mouth at bedtime. 30 tablet 0  . feeding supplement (BOOST / RESOURCE BREEZE) LIQD Take 1 Container by mouth 2 (two) times daily between meals. 30 Container 0  . feeding supplement, ENSURE ENLIVE, (ENSURE ENLIVE) LIQD Take 237 mLs by mouth 2 (two) times daily between meals. 237 mL 12  . fluticasone (FLONASE) 50 MCG/ACT nasal spray Place 2 sprays into both nostrils daily. 16 g 2  . metoprolol succinate (TOPROL XL) 50 MG 24 hr tablet Take 1 tablet (50 mg total) by mouth daily. Take with or immediately following a meal. 30  tablet 5  . Multiple Vitamin (MULTIVITAMIN WITH MINERALS) TABS tablet Take 1 tablet by mouth daily. 30 tablet 0  . omeprazole (PRILOSEC) 20 MG capsule Take 1 capsule (20 mg total) by mouth daily. 30 capsule 2  . polyethylene glycol (MIRALAX / GLYCOLAX) packet Take 17 g by mouth daily. 14 each 0  . Respiratory Therapy Supplies (FLUTTER) DEVI Use as directed. 1 each 0  . senna-docusate (SENOKOT-S) 8.6-50 MG tablet Take 1 tablet by mouth at bedtime. 30 tablet 0  . simvastatin (ZOCOR) 40 MG tablet Take 40 mg by mouth daily. Once daily    . amLODipine (NORVASC) 2.5 MG tablet Take 1 tablet (2.5 mg total) by mouth daily. 90 tablet 3   No current facility-administered medications for this visit.     Allergies  Allergen Reactions  . Lexapro [Escitalopram] Other (See Comments)    Severe hyponatremia/ SIADH Jun 2018  . Thiazide-Type Diuretics Other (See Comments)    Severe hyponatremia/ SIADH Jun 2018    Past Medical History:  Diagnosis Date  . Anxiety   . Coronary artery calcification seen on CAT scan 01/01/2018  . Depression   . GERD (gastroesophageal reflux disease)   . Heart murmur   . Hypercholesteremia   . Hypertension   . IFG (impaired fasting glucose)   . Palpitation 01/01/2018  . Tubular adenoma of colon  Social History   Socioeconomic History  . Marital status: Widowed    Spouse name: Not on file  . Number of children: Not on file  . Years of education: Not on file  . Highest education level: Not on file  Occupational History  . 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 Financial resource strain: Not on file  . Food insecurity:    Worry: Not on file    Inability: Not on file  . Transportation needs:    Medical: Not on file    Non-medical: Not on file  Tobacco Use  . Smoking status: Never Smoker  . Smokeless tobacco: Never Used  Substance and Sexual Activity  . Alcohol use: No  . Drug use: No  . Sexual activity: Never  Lifestyle  . Physical  activity:    Days per week: Not on file    Minutes per session: Not on file  . Stress: Not on file  Relationships  . Social connections:    Talks on phone: Not on file    Gets together: Not on file    Attends religious service: Not on file    Active member of club or organization: Not on file    Attends meetings of clubs or organizations: Not on file    Relationship status: Not on file  . Intimate partner violence:    Fear of current or ex partner: Not on file    Emotionally abused: Not on file    Physically abused: Not on file    Forced sexual activity: Not on file  Other Topics Concern  . Not on file  Social History Narrative  . Not on file     Family History  Problem Relation Age of Onset  . Emphysema Father   . Brain cancer Brother   . Alzheimer's disease Mother   . Cancer Daughter   . Kidney cancer Brother      Review of Systems: General: negative for chills, fever, night sweats or weight changes.  Cardiovascular: negative for chest pain, dyspnea on exertion, edema, orthopnea, palpitations, paroxysmal nocturnal dyspnea or shortness of breath Dermatological: negative for rash Respiratory: negative for cough or wheezing Urologic: negative for hematuria Abdominal: negative for nausea, vomiting, diarrhea, bright red blood per rectum, melena, or hematemesis Neurologic: negative for visual changes, syncope, or dizziness All other systems reviewed and are otherwise negative except as noted above.    Blood pressure (!) 191/83, pulse 61, height 5\' 3"  (1.6 m), weight 91 lb 3.2 oz (41.4 kg).  General appearance: alert, cooperative, cachectic and no distress Neck: no carotid bruit and no JVD Lungs: clear to auscultation bilaterally Heart: RRR atrial bigeminy on exam Extremities: extremities normal, atraumatic, no cyanosis or edema Skin: Skin color, texture, turgor normal. No rashes or lesions Neurologic: Grossly normal   ASSESSMENT AND PLAN:   Palpitation Changed  from Coreg to Toprol with symptomatic improvement Atrial bigeminy on exam today  HTN (hypertension) Since Coreg was stopped her B/P has drifted up. It's hard to tell with her frequent PACs but I believe her baseline systolic B/P is high- 818 systolic.   Bronchiectasis without acute exacerbation ([place]) Followed by Dr Lamonte Sakai. hx bronchiectasis> She is colonized with M avium. He attempted to treat w abx but these were stopped due to side effects  Dementia Pleasantly demented.    PLAN  I suggested the pt start Norvasc 2.5 mg daily. The Coreg was probably better at controlling her B/P than the  Toprol is. I encouraged her to watch her caffeine intake. She has a f/u with her PCP in a few weeks, Norvasc can be increased then if needed. We will be happy to see her again as needed.   Kerin Ransom PA-C 02/03/2018 1:38 PM

## 2018-02-03 NOTE — Assessment & Plan Note (Signed)
Changed from Coreg to Toprol with symptomatic improvement Atrial bigeminy on exam today

## 2018-02-03 NOTE — Assessment & Plan Note (Signed)
Pleasantly demented.

## 2018-02-03 NOTE — Assessment & Plan Note (Signed)
Followed by Dr Lamonte Sakai. hx bronchiectasis> She is colonized with M avium. He attempted to treat w abx but these were stopped due to side effects

## 2018-02-03 NOTE — Patient Instructions (Addendum)
Kerin Ransom, PA-C has recommended making the following medication changes: 1. START Amlodipine 2.5 mg - take 1 tablet by mouth daily  Your physician recommends that you follow-up with cardiology as needed.

## 2018-02-03 NOTE — Assessment & Plan Note (Signed)
Since Coreg was stopped her B/P has drifted up. It's hard to tell with her frequent PACs but I believe her baseline systolic B/P is high- 939 systolic.

## 2018-02-11 ENCOUNTER — Other Ambulatory Visit: Payer: Self-pay | Admitting: Emergency Medicine

## 2018-02-24 ENCOUNTER — Other Ambulatory Visit: Payer: Self-pay | Admitting: Emergency Medicine

## 2018-02-25 DIAGNOSIS — R410 Disorientation, unspecified: Secondary | ICD-10-CM | POA: Diagnosis not present

## 2018-02-25 DIAGNOSIS — H6123 Impacted cerumen, bilateral: Secondary | ICD-10-CM | POA: Diagnosis not present

## 2018-02-25 DIAGNOSIS — R3129 Other microscopic hematuria: Secondary | ICD-10-CM | POA: Diagnosis not present

## 2018-03-23 DIAGNOSIS — Z681 Body mass index (BMI) 19 or less, adult: Secondary | ICD-10-CM | POA: Diagnosis not present

## 2018-03-23 DIAGNOSIS — F411 Generalized anxiety disorder: Secondary | ICD-10-CM | POA: Diagnosis not present

## 2018-03-23 DIAGNOSIS — F5102 Adjustment insomnia: Secondary | ICD-10-CM | POA: Diagnosis not present

## 2018-03-23 DIAGNOSIS — Z0000 Encounter for general adult medical examination without abnormal findings: Secondary | ICD-10-CM | POA: Diagnosis not present

## 2018-03-23 DIAGNOSIS — I10 Essential (primary) hypertension: Secondary | ICD-10-CM | POA: Diagnosis not present

## 2018-03-23 DIAGNOSIS — F331 Major depressive disorder, recurrent, moderate: Secondary | ICD-10-CM | POA: Diagnosis not present

## 2018-03-23 DIAGNOSIS — F0150 Vascular dementia without behavioral disturbance: Secondary | ICD-10-CM | POA: Diagnosis not present

## 2018-03-23 DIAGNOSIS — R630 Anorexia: Secondary | ICD-10-CM | POA: Diagnosis not present

## 2018-03-23 DIAGNOSIS — R636 Underweight: Secondary | ICD-10-CM | POA: Diagnosis not present

## 2018-03-23 DIAGNOSIS — E7800 Pure hypercholesterolemia, unspecified: Secondary | ICD-10-CM | POA: Diagnosis not present

## 2018-05-06 ENCOUNTER — Ambulatory Visit: Payer: PPO | Admitting: Cardiovascular Disease

## 2018-05-06 ENCOUNTER — Encounter: Payer: Self-pay | Admitting: Cardiovascular Disease

## 2018-05-06 VITALS — BP 111/61 | HR 83 | Ht 63.0 in | Wt 99.2 lb

## 2018-05-06 DIAGNOSIS — I10 Essential (primary) hypertension: Secondary | ICD-10-CM | POA: Diagnosis not present

## 2018-05-06 DIAGNOSIS — R002 Palpitations: Secondary | ICD-10-CM | POA: Diagnosis not present

## 2018-05-06 DIAGNOSIS — E7800 Pure hypercholesterolemia, unspecified: Secondary | ICD-10-CM | POA: Diagnosis not present

## 2018-05-06 DIAGNOSIS — I2510 Atherosclerotic heart disease of native coronary artery without angina pectoris: Secondary | ICD-10-CM

## 2018-05-06 NOTE — Progress Notes (Signed)
Cardiology Office Note   Date:  05/06/2018   ID:  [name], DOB August 27, 1934, MRN [id number]  PCP:  Heywood Bene, PA-C  Cardiologist:   Skeet Latch, MD   No chief complaint on file.   History of Present Illness: [name] [name] is a 83 y.o. female with dementia  who is being seen today for the evaluation of palpitations at the request of Williams, Breejante J, *.   [name] [name] has several months of heart racing and chest pain.  She was seen in the ED with chest pain 08/2017.  At that time cardiac enzymes were negative.  She was noted to have PACs.  She saw her PCP on 12/22/2017 and to continue to complain of heart racing for the preceding 3 or 4 weeks.  Her EKG at that appointment showed sinus rhythm with atrial bigeminy.  She states that the heart racing is constant but worse at times.  It is not associated with exertion.  She sometimes has shortness of breath but not with each episode.  There is no associated chest pain.  She has not had any lower extremity edema, orthopnea, or PND.  She does not get much exercise.  Her daughter has given her Xanax which does seem to help.  She also reports associated lightheadedness and near syncope but no frank syncope.  When it occurs she thinks she is having a heart attack.,  Several teas throughout the day, and 1 Coke in the afternoon.  At her last appointment [name] [name] complained of palpitations.  Her blood pressure was low so metoprolol was started in place of carvedilol.  She followed up with Kerin Ransom and she was hypertensive so amlodipine was added.  Given that she had coronary calcification but was not very active she had a [employer] 01/2018 that was negative for ischemia.  She has been feeling very well.  Since starting metoprolol she has not had any more palpitations.  Her blood pressure has been very well-controlled at all of her appointments.  She wonders if she is too old to exercise or she can start back walking on the  treadmill.  She has not expands any chest pain or pressure.  Her breathing has been stable and she has no edema.  She denies lightheadedness or dizziness.   Past Medical History:  Diagnosis Date  . Anxiety   . Coronary artery calcification seen on CAT scan 01/01/2018  . Depression   . GERD (gastroesophageal reflux disease)   . Heart murmur   . Hypercholesteremia   . Hypertension   . IFG (impaired fasting glucose)   . Palpitation 01/01/2018  . Tubular adenoma of colon     Past Surgical History:  Procedure Laterality Date  . VIDEO BRONCHOSCOPY Bilateral 04/16/2015   Procedure: VIDEO BRONCHOSCOPY WITHOUT FLUORO;  Surgeon: Collene Gobble, MD;  Location: [facility];  Service: Cardiopulmonary;  Laterality: Bilateral;     Current Outpatient Medications  Medication Sig Dispense Refill  . acetaminophen (TYLENOL) 500 MG tablet Take 500 mg by mouth every 6 (six) hours as needed for mild pain.     Marland Kitchen albuterol (PROVENTIL HFA;VENTOLIN HFA) 108 (90 Base) MCG/ACT inhaler Inhale 2 puffs into the lungs every 6 (six) hours as needed for wheezing or shortness of breath. 1 Inhaler 2  . albuterol (PROVENTIL) (2.5 MG/3ML) 0.083% nebulizer solution Take 3 mLs (2.5 mg total) by nebulization every 6 (six) hours as needed for wheezing or shortness of breath. 75 mL 12  .  ALPRAZolam (XANAX) 0.5 MG tablet 1/2 - 1 TABLET BY MOUTH TWICE A DAY AS NEEDED  5  . amLODipine (NORVASC) 2.5 MG tablet Take 1 tablet (2.5 mg total) by mouth daily. 90 tablet 3  . aspirin 81 MG chewable tablet Chew 81 mg by mouth daily.    Marland Kitchen buPROPion (WELLBUTRIN XL) 150 MG 24 hr tablet Take by mouth.    . donepezil (ARICEPT) 5 MG tablet Take 1 tablet (5 mg total) by mouth at bedtime. 30 tablet 0  . feeding supplement (BOOST / RESOURCE BREEZE) LIQD Take 1 Container by mouth 2 (two) times daily between meals. 30 Container 0  . feeding supplement, ENSURE ENLIVE, (ENSURE ENLIVE) LIQD Take 237 mLs by mouth 2 (two) times daily between meals. 237 mL  12  . fluticasone (FLONASE) 50 MCG/ACT nasal spray SPRAY 2 SPRAYS INTO EACH NOSTRIL EVERY DAY 48 g 1  . metoprolol succinate (TOPROL XL) 50 MG 24 hr tablet Take 1 tablet (50 mg total) by mouth daily. Take with or immediately following a meal. 90 tablet 3  . Multiple Vitamin (MULTIVITAMIN WITH MINERALS) TABS tablet Take 1 tablet by mouth daily. 30 tablet 0  . omeprazole (PRILOSEC) 20 MG capsule TAKE 1 CAPSULE BY MOUTH EVERY DAY 30 capsule 5  . polyethylene glycol (MIRALAX / GLYCOLAX) packet Take 17 g by mouth daily. 14 each 0  . Respiratory Therapy Supplies (FLUTTER) DEVI Use as directed. 1 each 0  . senna-docusate (SENOKOT-S) 8.6-50 MG tablet Take 1 tablet by mouth at bedtime. 30 tablet 0  . simvastatin (ZOCOR) 40 MG tablet Take 40 mg by mouth daily. Once daily     No current facility-administered medications for this visit.     Allergies:   Lexapro [escitalopram] and Thiazide-type diuretics    Social History:  The patient  reports that she has never smoked. She has never used smokeless tobacco. She reports that she does not drink alcohol or use drugs.   Family History:  The patient's family history includes Alzheimer's disease in her mother; Brain cancer in her brother; Cancer in her daughter; Emphysema in her father; Kidney cancer in her brother.    ROS:  Please see the history of present illness.   Otherwise, review of systems are positive for none.   All other systems are reviewed and negative.    PHYSICAL EXAM: VS:  BP 111/61   Pulse 83   Ht 5\' 3"  (1.6 m)   Wt 99 lb 3.2 oz (45 kg)   BMI 17.57 kg/m  , BMI Body mass index is 17.57 kg/m. GENERAL:  Well appearing HEENT: Pupils equal round and reactive, fundi not visualized, oral mucosa unremarkable NECK:  No jugular venous distention, waveform within normal limits, carotid upstroke brisk and symmetric, no bruits LUNGS:  Clear to auscultation bilaterally HEART:  RRR.  PMI not displaced or sustained,S1 and S2 within normal limits,  no S3, no S4, no clicks, no rubs, no murmurs ABD:  Flat, positive bowel sounds normal in frequency in pitch, no bruits, no rebound, no guarding, no midline pulsatile mass, no hepatomegaly, no splenomegaly EXT:  2 plus pulses throughout, no edema, no cyanosis no clubbing SKIN:  No rashes no nodules NEURO:  Cranial nerves II through XII grossly intact, motor grossly intact throughout PSYCH:  Cognitively intact, oriented to person place and time   EKG:  EKG is not ordered today. 12/21/17: Sinus rhythm.  Atrial bigeminy and atrial tachycardia.  Rate 82 bpm.   Recent Labs: 08/31/2017: BUN  9; Creatinine, Ser 0.66; Hemoglobin 12.7; Platelets 269; Potassium 3.9; Sodium 139    Lipid Panel    Component Value Date/Time   CHOL  10/12/2009 0447    144        ATP III CLASSIFICATION:  <200     mg/dL   Desirable  200-239  mg/dL   Borderline High  >=240    mg/dL   High          TRIG 176 (H) 10/12/2009 0447   HDL 42 10/12/2009 0447   CHOLHDL 3.4 10/12/2009 0447   VLDL 35 10/12/2009 0447   LDLCALC  10/12/2009 0447    67        Total Cholesterol/HDL:CHD Risk Coronary Heart Disease Risk Table                     Men   Women  1/2 Average Risk   3.4   3.3  Average Risk       5.0   4.4  2 X Average Risk   9.6   7.1  3 X Average Risk  23.4   11.0        Use the calculated Patient Ratio above and the CHD Risk Table to determine the patient's CHD Risk.        ATP III CLASSIFICATION (LDL):  <100     mg/dL   Optimal  100-129  mg/dL   Near or Above                    Optimal  130-159  mg/dL   Borderline  160-189  mg/dL   High  >190     mg/dL   Very High      Wt Readings from Last 3 Encounters:  05/06/18 99 lb 3.2 oz (45 kg)  02/03/18 91 lb 3.2 oz (41.4 kg)  01/28/18 90 lb (40.8 kg)      ASSESSMENT AND PLAN:  # PACs: # Palpitations:  Symptoms are much better since switching from carvedilol to metoprolol.  No changes at this time.  # Essential hypertension:  Blood pressure was  elevated after switching to metoprolol.  She was started on amlodipine and her blood pressure has been very well-controlled.  # Asymptomatic coronary calcification: # Atypical chest pain: Continue aspirin 81 mg, metoprolol and simvastatin.  She was encouraged to start back exercising each day.  Current medicines are reviewed at length with the patient today.  The patient does not have concerns regarding medicines.  The following changes have been made: None  Labs/ tests ordered today include:   No orders of the defined types were placed in this encounter.    Disposition:   FU with [name] Olarte C. Oval Linsey, MD, [facility] in 1 year.     Signed, [name] Wence C. Oval Linsey, MD, [facility]  05/06/2018 2:12 PM    [facility]

## 2018-05-06 NOTE — Patient Instructions (Signed)
Medication Instructions:  Your physician recommends that you continue on your current medications as directed. Please refer to the Current Medication list given to you today.  Labwork: NONE  Testing/Procedures: NONE  Follow-Up: Your physician wants you to follow-up in: 1 YEAR  You will receive a reminder letter in the mail two months in advance. If you don't receive a letter, please call our office to schedule the follow-up appointment.  If you need a refill on your cardiac medications before your next appointment, please call your pharmacy.  

## 2018-08-17 ENCOUNTER — Other Ambulatory Visit: Payer: Self-pay | Admitting: *Deleted

## 2018-08-17 MED ORDER — OMEPRAZOLE 20 MG PO CPDR: DELAYED_RELEASE_CAPSULE | ORAL | 0 refills | Status: AC

## 2019-01-04 DIAGNOSIS — I10 Essential (primary) hypertension: Secondary | ICD-10-CM | POA: Diagnosis not present

## 2019-01-04 DIAGNOSIS — F0390 Unspecified dementia without behavioral disturbance: Secondary | ICD-10-CM | POA: Diagnosis not present

## 2019-01-04 DIAGNOSIS — F331 Major depressive disorder, recurrent, moderate: Secondary | ICD-10-CM | POA: Diagnosis not present

## 2019-01-04 DIAGNOSIS — E7800 Pure hypercholesterolemia, unspecified: Secondary | ICD-10-CM | POA: Diagnosis not present

## 2019-01-04 DIAGNOSIS — R636 Underweight: Secondary | ICD-10-CM | POA: Diagnosis not present

## 2019-01-04 DIAGNOSIS — F419 Anxiety disorder, unspecified: Secondary | ICD-10-CM | POA: Diagnosis not present

## 2019-01-04 DIAGNOSIS — R630 Anorexia: Secondary | ICD-10-CM | POA: Diagnosis not present

## 2019-06-27 DIAGNOSIS — R636 Underweight: Secondary | ICD-10-CM | POA: Diagnosis not present

## 2019-06-27 DIAGNOSIS — B351 Tinea unguium: Secondary | ICD-10-CM | POA: Diagnosis not present

## 2019-06-27 DIAGNOSIS — R6881 Early satiety: Secondary | ICD-10-CM | POA: Diagnosis not present

## 2019-06-27 DIAGNOSIS — R634 Abnormal weight loss: Secondary | ICD-10-CM | POA: Diagnosis not present

## 2019-08-08 ENCOUNTER — Encounter: Payer: Self-pay | Admitting: Gastroenterology

## 2019-08-08 DIAGNOSIS — F331 Major depressive disorder, recurrent, moderate: Secondary | ICD-10-CM | POA: Diagnosis not present

## 2019-08-08 DIAGNOSIS — R6881 Early satiety: Secondary | ICD-10-CM | POA: Diagnosis not present

## 2019-08-08 DIAGNOSIS — B351 Tinea unguium: Secondary | ICD-10-CM | POA: Diagnosis not present

## 2019-08-08 DIAGNOSIS — I10 Essential (primary) hypertension: Secondary | ICD-10-CM | POA: Diagnosis not present

## 2019-08-08 DIAGNOSIS — R634 Abnormal weight loss: Secondary | ICD-10-CM | POA: Diagnosis not present

## 2019-08-08 DIAGNOSIS — R636 Underweight: Secondary | ICD-10-CM | POA: Diagnosis not present

## 2019-08-08 DIAGNOSIS — E7800 Pure hypercholesterolemia, unspecified: Secondary | ICD-10-CM | POA: Diagnosis not present

## 2019-08-08 DIAGNOSIS — F419 Anxiety disorder, unspecified: Secondary | ICD-10-CM | POA: Diagnosis not present

## 2019-08-08 DIAGNOSIS — Z0000 Encounter for general adult medical examination without abnormal findings: Secondary | ICD-10-CM | POA: Diagnosis not present

## 2019-09-14 ENCOUNTER — Ambulatory Visit: Payer: PPO | Admitting: Gastroenterology

## 2019-09-14 NOTE — Progress Notes (Deleted)
[facility] Consult Note:  History: [name] [name] 09/14/2019  Referring provider: Heywood Bene, PA-C  Reason for consult/chief complaint: No chief complaint on file.   Subjective  HPI: Referred by primary care after office visit there last month with follow-up of multiple medical issues.  Among them was weight loss with decreased appetite.  There was some concern that perhaps patient was consuming too much liquids before meals and therefore getting full easily. Last colonoscopy was a screening exam by Dr. Ardis Hughs in November 2007.  Diminutive adenomatous polyp removed. ***   ROS:  Review of Systems   Past Medical History: Past Medical History:  Diagnosis Date  . Anxiety   . Coronary artery calcification seen on CAT scan 01/01/2018  . Depression   . GERD (gastroesophageal reflux disease)   . Heart murmur   . Hypercholesteremia   . Hypertension   . IFG (impaired fasting glucose)   . Palpitation 01/01/2018  . Tubular adenoma of colon      Past Surgical History: Past Surgical History:  Procedure Laterality Date  . VIDEO BRONCHOSCOPY Bilateral 04/16/2015   Procedure: VIDEO BRONCHOSCOPY WITHOUT FLUORO;  Surgeon: Collene Gobble, MD;  Location: [place];  Service: Cardiopulmonary;  Laterality: Bilateral;     Family History: Family History  Problem Relation Age of Onset  . Emphysema Father   . Brain cancer Brother   . Alzheimer's disease Mother   . Cancer Daughter   . Kidney cancer Brother     Social History: Social History   Socioeconomic History  . Marital status: Widowed    Spouse name: Not on file  . Number of children: Not on file  . Years of education: Not on file  . Highest education level: Not on file  Occupational History  . Occupation: Network engineer    Comment: retired  Tobacco Use  . Smoking status: Never Smoker  . Smokeless tobacco: Never Used  Substance and Sexual Activity  . Alcohol use: No  . Drug use: No    . Sexual activity: Never  Other Topics Concern  . Not on file  Social History Narrative  . Not on file   Social Determinants of Health   Financial Resource Strain:   . Difficulty of Paying Living Expenses: Not on file  Food Insecurity:   . Worried About Charity fundraiser in the Last Year: Not on file  . Ran Out of Food in the Last Year: Not on file  Transportation Needs:   . Lack of Transportation (Medical): Not on file  . Lack of Transportation (Non-Medical): Not on file  Physical Activity:   . Days of Exercise per Week: Not on file  . Minutes of Exercise per Session: Not on file  Stress:   . Feeling of Stress : Not on file  Social Connections:   . Frequency of Communication with Friends and Family: Not on file  . Frequency of Social Gatherings with Friends and Family: Not on file  . Attends Religious Services: Not on file  . Active Member of Clubs or Organizations: Not on file  . Attends Archivist Meetings: Not on file  . Marital Status: Not on file    Allergies: Allergies  Allergen Reactions  . Lexapro [Escitalopram] Other (See Comments)    Severe hyponatremia/ SIADH Jun 2018  . Thiazide-Type Diuretics Other (See Comments)    Severe hyponatremia/ SIADH Jun 2018    Outpatient Meds: Current Outpatient Medications  Medication Sig Dispense Refill  .  acetaminophen (TYLENOL) 500 MG tablet Take 500 mg by mouth every 6 (six) hours as needed for mild pain.     Marland Kitchen albuterol (PROVENTIL HFA;VENTOLIN HFA) 108 (90 Base) MCG/ACT inhaler Inhale 2 puffs into the lungs every 6 (six) hours as needed for wheezing or shortness of breath. 1 Inhaler 2  . albuterol (PROVENTIL) (2.5 MG/3ML) 0.083% nebulizer solution Take 3 mLs (2.5 mg total) by nebulization every 6 (six) hours as needed for wheezing or shortness of breath. 75 mL 12  . ALPRAZolam (XANAX) 0.5 MG tablet 1/2 - 1 TABLET BY MOUTH TWICE A DAY AS NEEDED  5  . amLODipine (NORVASC) 2.5 MG tablet Take 1 tablet (2.5 mg  total) by mouth daily. 90 tablet 3  . aspirin 81 MG chewable tablet Chew 81 mg by mouth daily.    Marland Kitchen buPROPion (WELLBUTRIN XL) 150 MG 24 hr tablet Take by mouth.    . donepezil (ARICEPT) 5 MG tablet Take 1 tablet (5 mg total) by mouth at bedtime. 30 tablet 0  . feeding supplement (BOOST / RESOURCE BREEZE) LIQD Take 1 Container by mouth 2 (two) times daily between meals. 30 Container 0  . feeding supplement, ENSURE ENLIVE, (ENSURE ENLIVE) LIQD Take 237 mLs by mouth 2 (two) times daily between meals. 237 mL 12  . fluticasone (FLONASE) 50 MCG/ACT nasal spray SPRAY 2 SPRAYS INTO EACH NOSTRIL EVERY DAY 48 g 1  . metoprolol succinate (TOPROL XL) 50 MG 24 hr tablet Take 1 tablet (50 mg total) by mouth daily. Take with or immediately following a meal. 90 tablet 3  . Multiple Vitamin (MULTIVITAMIN WITH MINERALS) TABS tablet Take 1 tablet by mouth daily. 30 tablet 0  . omeprazole (PRILOSEC) 20 MG capsule TAKE 1 CAPSULE BY MOUTH EVERY DAY 30 capsule 0  . polyethylene glycol (MIRALAX / GLYCOLAX) packet Take 17 g by mouth daily. 14 each 0  . Respiratory Therapy Supplies (FLUTTER) DEVI Use as directed. 1 each 0  . senna-docusate (SENOKOT-S) 8.6-50 MG tablet Take 1 tablet by mouth at bedtime. 30 tablet 0  . simvastatin (ZOCOR) 40 MG tablet Take 40 mg by mouth daily. Once daily     No current facility-administered medications for this visit.      ___________________________________________________________________ Objective   Exam:  There were no vitals taken for this visit.   General: ***   Eyes: sclera anicteric, no redness  ENT: oral mucosa moist without lesions, no cervical or supraclavicular lymphadenopathy  CV: RRR without murmur, S1/S2, no JVD, no peripheral edema  Resp: clear to auscultation bilaterally, normal RR and effort noted  GI: soft, *** tenderness, with active bowel sounds. No guarding or palpable organomegaly noted.  Skin; warm and dry, no rash or jaundice noted  Neuro:  awake, alert and oriented x 3. Normal gross motor function and fluent speech  Labs:  Labs in most recent primary care note from December 2020 showed normal CBC and CMP normal except bicarb elevated at 32.  Albumin normal at 4.4  Radiologic Studies:  ***  Assessment: No diagnosis found.  ***  Plan:  ***  Thank you for the courtesy of this consult.  Please call me with any questions or concerns.  Nelida Meuse III  CC: Referring provider noted above

## 2019-11-29 ENCOUNTER — Telehealth: Payer: PPO | Admitting: Cardiology

## 2020-02-06 DIAGNOSIS — E7800 Pure hypercholesterolemia, unspecified: Secondary | ICD-10-CM | POA: Diagnosis not present

## 2020-02-06 DIAGNOSIS — R636 Underweight: Secondary | ICD-10-CM | POA: Diagnosis not present

## 2020-02-06 DIAGNOSIS — Z23 Encounter for immunization: Secondary | ICD-10-CM | POA: Diagnosis not present

## 2020-02-06 DIAGNOSIS — I10 Essential (primary) hypertension: Secondary | ICD-10-CM | POA: Diagnosis not present

## 2020-02-06 DIAGNOSIS — F419 Anxiety disorder, unspecified: Secondary | ICD-10-CM | POA: Diagnosis not present

## 2020-02-06 DIAGNOSIS — F331 Major depressive disorder, recurrent, moderate: Secondary | ICD-10-CM | POA: Diagnosis not present

## 2020-02-20 DIAGNOSIS — R42 Dizziness and giddiness: Secondary | ICD-10-CM | POA: Diagnosis not present

## 2020-06-19 DIAGNOSIS — M546 Pain in thoracic spine: Secondary | ICD-10-CM | POA: Diagnosis not present

## 2020-06-19 DIAGNOSIS — U071 COVID-19: Secondary | ICD-10-CM | POA: Diagnosis not present

## 2020-06-19 DIAGNOSIS — Z20822 Contact with and (suspected) exposure to covid-19: Secondary | ICD-10-CM | POA: Diagnosis not present

## 2020-08-06 DIAGNOSIS — I10 Essential (primary) hypertension: Secondary | ICD-10-CM | POA: Diagnosis not present

## 2020-08-06 DIAGNOSIS — F331 Major depressive disorder, recurrent, moderate: Secondary | ICD-10-CM | POA: Diagnosis not present

## 2020-08-06 DIAGNOSIS — R636 Underweight: Secondary | ICD-10-CM | POA: Diagnosis not present

## 2020-08-06 DIAGNOSIS — E7800 Pure hypercholesterolemia, unspecified: Secondary | ICD-10-CM | POA: Diagnosis not present

## 2020-08-06 DIAGNOSIS — F419 Anxiety disorder, unspecified: Secondary | ICD-10-CM | POA: Diagnosis not present

## 2020-08-27 NOTE — Progress Notes (Signed)
Virtual Visit via Telephone Note   This visit type was conducted due to national recommendations for restrictions regarding the COVID-19 Pandemic (e.g. social distancing) in an effort to limit this patient's exposure and mitigate transmission in our community.  Due to her co-morbid illnesses, this patient is at least at moderate risk for complications without adequate follow up.  This format is felt to be most appropriate for this patient at this time.  The patient did not have access to video technology/had technical difficulties with video requiring transitioning to audio format only (telephone).  All issues noted in this document were discussed and addressed.  No physical exam could be performed with this format.  Please refer to the patient's chart for her  consent to telehealth for [facility].  Evaluation Performed:  Follow-up visit  This visit type was conducted due to national recommendations for restrictions regarding the COVID-19 Pandemic (e.g. social distancing).  This format is felt to be most appropriate for this patient at this time.  All issues noted in this document were discussed and addressed.  No physical exam was performed (except for noted visual exam findings with Video Visits).  Please refer to the patient's chart (MyChart message for video visits and phone note for telephone visits) for the patient's consent to telehealth for [facility]  Date:  08/28/2020   ID:  [name] [name], DOB [birth year], MRN [id number]  Patient Location:  [id number]   Provider location:     [address] Office [phone] Fax [phone]   PCP:  Heywood Bene, PA-C  Cardiologist:  Skeet Latch, MD  Electrophysiologist:  None   Chief Complaint: Follow-up for hypertension and coronary artery disease  History of Present Illness:    [name] [name] is a 86 y.o. female who  presents via audio/video conferencing for a telehealth visit today.  Patient verified DOB and address.  [name] [name] has a PMH of palpitations, atypical chest pain, abnormal EKG (sinus rhythm with atrial bigeminy), mild coronary artery disease (CTA 2018).  Negative nuclear stress test 6/19  She was last seen by Dr. Oval Linsey 05/06/2018.  During that time she felt well.  She denied palpitations on metoprolol.  Her blood pressure was well controlled.  She reported she felt like she was too old to exercise.  She reported walking on her treadmill.  She denied chest pain or pressure, her breathing was stable, she denied lower extremity edema, lightheadedness and dizziness.  She is seen virtually today for follow-up and states she feels well.  She is accompanied by her daughter for the visit.  Her daughter indicates that she has been fairly sedentary lately due to her dementia.  She also has some dietary indiscretion.  However, from blood pressure remains well controlled at 120s over 70s.  She denies any recent episodes of accelerated heart rate or irregular beats.  She also denies lower extremity edema and chest pain.  She states that overall she is feeling fairly well.  I have encouraged them to do seated chair exercises and sitting/standing activities.  I will give her the salty 6 diet sheet and have her follow-up in 12 months.  Today she denies chest pain, shortness of breath, lower extremity edema, fatigue, palpitations, melena, hematuria, hemoptysis, diaphoresis, weakness, presyncope, syncope, orthopnea, and PND.   The patient does not symptoms concerning for COVID-19 infection (fever, chills, cough, or new SHORTNESS OF BREATH).  Prior CV studies:   The following studies were reviewed today:  Coronary CTA 08/13/2017 FINDINGS: Cardiovascular: There are no filling defects in the pulmonary arterial tree to suggest acute pulmonary thromboembolism. Mild 3 vessel coronary artery calcifications.  Atherosclerotic aortic calcifications are noted.  Mediastinum/Nodes: There is no abnormal mediastinal or hilar adenopathy. No pericardial effusion. Thyroid is atrophic.  Lungs/Pleura: Tree in bud nodules in both upper lobes as well as a superior segment of the lower lobes has increased. Bronchial wall thickening throughout the lungs most prominent in the right upper lobe is present. There are some indeterminate pulmonary opacities scattered throughout both lungs. Some of these have the appearance of airspace disease while others are masslike. There is a 1.1 cm mass in the right lower lobe on image 80. 1.0 cm mass in the superior segment of the left lower lobe on image 50. No pneumothorax. No pleural effusion. There is also subsegmental atelectasis in the right middle lobe and lingula. See image 66 of series 5.  Upper Abdomen: No acute abnormality.  Musculoskeletal: No vertebral compression deformity.  Review of the MIP images confirms the above findings.  IMPRESSION: No evidence of acute pulmonary thromboembolism.  Tree in bud nodules have worsened suggesting mycobacterium infection or viral pneumonia.  There are indeterminate pulmonary opacities bilaterally. Some have the appearance of airspace disease while others are masslike. Malignancy cannot be excluded. If the patient is immunocompromised, consider opportunistic infection. The largest mass is 1.1 cm in the right lower lobe. Initial follow-up by chest CT without contrast is recommended in 3 months to confirm persistence. This recommendation follows the consensus statement: Recommendations for the Management of Subsolid Pulmonary Nodules Detected at CT: A Statement from the [employer] as published in Radiology 2013; 266:304-317.  Aortic Atherosclerosis (ICD10-I70.0).  Past Medical History:  Diagnosis Date  . Anxiety   . Coronary artery calcification seen on CAT scan 01/01/2018  . Depression   . GERD  (gastroesophageal reflux disease)   . Heart murmur   . Hypercholesteremia   . Hypertension   . IFG (impaired fasting glucose)   . Palpitation 01/01/2018  . Tubular adenoma of colon    Past Surgical History:  Procedure Laterality Date  . VIDEO BRONCHOSCOPY Bilateral 04/16/2015   Procedure: VIDEO BRONCHOSCOPY WITHOUT FLUORO;  Surgeon: Collene Gobble, MD;  Location: [place];  Service: Cardiopulmonary;  Laterality: Bilateral;     Current Meds  Medication Sig  . albuterol (PROVENTIL HFA;VENTOLIN HFA) 108 (90 Base) MCG/ACT inhaler Inhale 2 puffs into the lungs every 6 (six) hours as needed for wheezing or shortness of breath.  Marland Kitchen albuterol (PROVENTIL) (2.5 MG/3ML) 0.083% nebulizer solution Take 3 mLs (2.5 mg total) by nebulization every 6 (six) hours as needed for wheezing or shortness of breath.  . ALPRAZolam (XANAX) 0.5 MG tablet 1/2 - 1 TABLET BY MOUTH TWICE A DAY AS NEEDED  . amLODipine (NORVASC) 2.5 MG tablet Take 1 tablet (2.5 mg total) by mouth daily.  Marland Kitchen aspirin 81 MG chewable tablet Chew 81 mg by mouth daily.  Marland Kitchen buPROPion (WELLBUTRIN XL) 150 MG 24 hr tablet Take by mouth.  . fluticasone (FLONASE) 50 MCG/ACT nasal spray SPRAY 2 SPRAYS INTO EACH NOSTRIL EVERY DAY  . metoprolol succinate (TOPROL XL) 50 MG 24 hr tablet Take 1 tablet (50 mg total) by mouth daily. Take with or immediately following a meal.  . omeprazole (PRILOSEC) 20 MG capsule TAKE 1 CAPSULE BY MOUTH EVERY DAY  . polyethylene glycol (MIRALAX / GLYCOLAX) packet Take 17  g by mouth daily.  Marland Kitchen senna-docusate (SENOKOT-S) 8.6-50 MG tablet Take 1 tablet by mouth at bedtime.  . simvastatin (ZOCOR) 40 MG tablet Take 40 mg by mouth daily. Once daily  . [DISCONTINUED] donepezil (ARICEPT) 5 MG tablet Take 1 tablet (5 mg total) by mouth at bedtime.     Allergies:   Lexapro [escitalopram] and Thiazide-type diuretics   Social History   Tobacco Use  . Smoking status: Never Smoker  . Smokeless tobacco: Never Used  Vaping Use  .  Vaping Use: Never used  Substance Use Topics  . Alcohol use: No  . Drug use: No     Family Hx: The patient's family history includes Alzheimer's disease in her mother; Brain cancer in her brother; Cancer in her daughter; Emphysema in her father; Kidney cancer in her brother.  ROS:   Please see the history of present illness.     All other systems reviewed and are negative.   Labs/Other Tests and Data Reviewed:    Recent Labs: No results found for requested labs within last 8760 hours.   Recent Lipid Panel Lab Results  Component Value Date/Time   CHOL  10/12/2009 04:47 AM    144        ATP III CLASSIFICATION:  <200     mg/dL   Desirable  200-239  mg/dL   Borderline High  >=240    mg/dL   High          TRIG 176 (H) 10/12/2009 04:47 AM   HDL 42 10/12/2009 04:47 AM   CHOLHDL 3.4 10/12/2009 04:47 AM   LDLCALC  10/12/2009 04:47 AM    67        Total Cholesterol/HDL:CHD Risk Coronary Heart Disease Risk Table                     Men   Women  1/2 Average Risk   3.4   3.3  Average Risk       5.0   4.4  2 X Average Risk   9.6   7.1  3 X Average Risk  23.4   11.0        Use the calculated Patient Ratio above and the CHD Risk Table to determine the patient's CHD Risk.        ATP III CLASSIFICATION (LDL):  <100     mg/dL   Optimal  100-129  mg/dL   Near or Above                    Optimal  130-159  mg/dL   Borderline  160-189  mg/dL   High  >190     mg/dL   Very High    Wt Readings from Last 3 Encounters:  05/06/18 99 lb 3.2 oz (45 kg)  02/03/18 91 lb 3.2 oz (41.4 kg)  01/28/18 90 lb (40.8 kg)     Exam:    Vital Signs:  There were no vitals taken for this visit.   Well nourished, well developed female in no  acute distress.   ASSESSMENT & PLAN:    1.  Palpitations/PACs- no recent episodes of accelerated heart rate or feeling of irregular heartbeats.  Continues to walk on a treadmill.  Hypotension with carvedilol. Continue metoprolol Heart healthy low-sodium  diet-salty 6 given Increase physical activity as tolerated  Essential hypertension-BP today unavailable.  Well-controlled PCP recently.  126/78 Continue metoprolol Heart healthy low-sodium diet-salty 6 given Increase physical activity as tolerated  Atypical chest pain/asymptomatic coronary calcification- no chest pain today.  Mild coronary artery disease seen on prior coronary CTA. Continue aspirin, metoprolol, simvastatin Heart healthy low-sodium diet-salty 6 given Increase physical activity as tolerated Continue to monitor  Disposition: Follow-up with Dr. Oval Linsey or me in 1 year.  COVID-19 Education: The signs and symptoms of COVID-19 were discussed with the patient and how to seek care for testing (follow up with PCP or arrange E-visit).  The importance of social distancing was discussed today.  Patient Risk:   After full review of this patients clinical status, I feel that they are at least moderate risk at this time.  Time:   Today, I have spent 7 minutes with the patient with telehealth technology discussing diet, exercise, medications, palpitations.  I spent greater than 20 minutes reviewing the patient's past medical history, cardiac test, and cardiac medications.   Medication Adjustments/Labs and Tests Ordered: Current medicines are reviewed at length with the patient today.  Concerns regarding medicines are outlined above.   Tests Ordered: No orders of the defined types were placed in this encounter.  Medication Changes: No orders of the defined types were placed in this encounter.   Disposition:  in 1 year(s)  Signed, Jossie Ng. Victor Langenbach NP-C    03/22/2019 11:58 AM    [address] Office [phone] Fax [phone]

## 2020-08-28 ENCOUNTER — Telehealth (INDEPENDENT_AMBULATORY_CARE_PROVIDER_SITE_OTHER): Payer: PPO | Admitting: General Practice

## 2020-08-28 ENCOUNTER — Encounter: Payer: Self-pay | Admitting: General Practice

## 2020-08-28 DIAGNOSIS — R002 Palpitations: Secondary | ICD-10-CM

## 2020-08-28 DIAGNOSIS — R0789 Other chest pain: Secondary | ICD-10-CM | POA: Diagnosis not present

## 2020-08-28 DIAGNOSIS — I10 Essential (primary) hypertension: Secondary | ICD-10-CM | POA: Diagnosis not present

## 2020-08-28 NOTE — Patient Instructions (Addendum)
Medication Instructions:  The current medical regimen is effective;  continue present plan and medications as directed. Please refer to the Current Medication list given to you today.  *If you need a refill on your cardiac medications before your next appointment, please call your pharmacy*  Lab Work:   Testing/Procedures:  none    none  Special Instructions  PLEASE READ AND FOLLOW SALTY 6-ATTACHED-1,800mg  daily  PLEASE INCREASE PHYSICAL ACTIVITY AS TOLERATED-CHAIR EXERCISES EX..LEG RAISES, ARM EXERCISES.   Follow-Up: Your next appointment:  12 month(s) In Person with You may see Skeet Latch, MD Coletta Memos, FNP or one of the following Advanced Practice Providers on your designated Care Team:  Kerin Ransom, PA-C Sande Rives, [place]  OR IF UNAVAILABLE Coletta Memos, FNP-C   Please call our office 2 months in advance to schedule this appointment   At [facility], you and your health needs are our priority.  As part of our continuing mission to provide you with exceptional heart care, we have created designated Provider Care Teams.  These Care Teams include your primary Cardiologist (physician) and Advanced Practice Providers (APPs -  Physician Assistants and Nurse Practitioners) who all work together to provide you with the care you need, when you need it.  We recommend signing up for the patient portal called "MyChart".  Sign up information is provided on this After Visit Summary.  MyChart is used to connect with patients for Virtual Visits (Telemedicine).  Patients are able to view lab/test results, encounter notes, upcoming appointments, etc.  Non-urgent messages can be sent to your provider as well.   To learn more about what you can do with MyChart, go to [email or web address].              6 SALTY THINGS TO AVOID     1,800MG  DAILY

## 2021-02-04 DIAGNOSIS — Z23 Encounter for immunization: Secondary | ICD-10-CM | POA: Diagnosis not present

## 2021-02-04 DIAGNOSIS — Z0000 Encounter for general adult medical examination without abnormal findings: Secondary | ICD-10-CM | POA: Diagnosis not present

## 2021-02-04 DIAGNOSIS — E7800 Pure hypercholesterolemia, unspecified: Secondary | ICD-10-CM | POA: Diagnosis not present

## 2021-02-04 DIAGNOSIS — F5102 Adjustment insomnia: Secondary | ICD-10-CM | POA: Diagnosis not present

## 2021-02-04 DIAGNOSIS — F419 Anxiety disorder, unspecified: Secondary | ICD-10-CM | POA: Diagnosis not present

## 2021-02-04 DIAGNOSIS — F331 Major depressive disorder, recurrent, moderate: Secondary | ICD-10-CM | POA: Diagnosis not present

## 2021-02-04 DIAGNOSIS — R7301 Impaired fasting glucose: Secondary | ICD-10-CM | POA: Diagnosis not present

## 2021-02-04 DIAGNOSIS — I10 Essential (primary) hypertension: Secondary | ICD-10-CM | POA: Diagnosis not present

## 2021-06-27 DIAGNOSIS — R059 Cough, unspecified: Secondary | ICD-10-CM | POA: Diagnosis not present

## 2021-06-27 DIAGNOSIS — R5383 Other fatigue: Secondary | ICD-10-CM | POA: Diagnosis not present

## 2021-06-27 DIAGNOSIS — R6883 Chills (without fever): Secondary | ICD-10-CM | POA: Diagnosis not present

## 2021-06-27 DIAGNOSIS — Z20822 Contact with and (suspected) exposure to covid-19: Secondary | ICD-10-CM | POA: Diagnosis not present

## 2021-06-27 DIAGNOSIS — J3489 Other specified disorders of nose and nasal sinuses: Secondary | ICD-10-CM | POA: Diagnosis not present

## 2021-06-27 DIAGNOSIS — R0600 Dyspnea, unspecified: Secondary | ICD-10-CM | POA: Diagnosis not present

## 2021-06-27 DIAGNOSIS — J019 Acute sinusitis, unspecified: Secondary | ICD-10-CM | POA: Diagnosis not present

## 2021-08-06 DIAGNOSIS — I10 Essential (primary) hypertension: Secondary | ICD-10-CM | POA: Diagnosis not present

## 2021-08-06 DIAGNOSIS — Z23 Encounter for immunization: Secondary | ICD-10-CM | POA: Diagnosis not present

## 2021-08-06 DIAGNOSIS — F331 Major depressive disorder, recurrent, moderate: Secondary | ICD-10-CM | POA: Diagnosis not present

## 2021-08-06 DIAGNOSIS — F419 Anxiety disorder, unspecified: Secondary | ICD-10-CM | POA: Diagnosis not present

## 2021-08-06 DIAGNOSIS — E7800 Pure hypercholesterolemia, unspecified: Secondary | ICD-10-CM | POA: Diagnosis not present

## 2021-08-06 DIAGNOSIS — R636 Underweight: Secondary | ICD-10-CM | POA: Diagnosis not present

## 2022-06-18 DEATH — deceased
